# Patient Record
Sex: Male | Born: 1960 | Race: White | Hispanic: No | Marital: Married
Health system: Midwestern US, Community
[De-identification: ages and names within clinical notes are randomized; demographics above are authoritative.]

## PROBLEM LIST (undated history)

## (undated) DIAGNOSIS — E119 Type 2 diabetes mellitus without complications: Principal | ICD-10-CM

## (undated) DIAGNOSIS — I10 Essential (primary) hypertension: Secondary | ICD-10-CM

---

## 2012-02-04 LAB — LIPID PANEL
Chol/HDL Ratio: 4 (ref <5)
Cholesterol: 137 mg/dL (ref <200)
HDL: 37 mg/dL (ref 35–55)
LDL Cholesterol: 76 mg/dL (ref 0–130)
Triglycerides: 121 mg/dL (ref 0–200)
VLDL: 24 mg/dL (ref 1–30)

## 2012-02-04 LAB — COMPREHENSIVE METABOLIC PANEL
ALT: 49 U/L — ABNORMAL HIGH (ref 5–41)
AST: 29 U/L (ref <40)
Albumin/Globulin Ratio: 1.4 — ABNORMAL LOW (ref 1.7–2.5)
Albumin: 4.5 g/dL (ref 3.5–5.2)
Alkaline Phosphatase: 49 U/L (ref 40–129)
BUN: 17 mg/dL (ref 6–20)
Bun/Cre Ratio: 28 — ABNORMAL HIGH (ref 9–20)
CO2: 31 mmol/L (ref 20–31)
Calcium: 10.3 mg/dL — ABNORMAL HIGH (ref 8.6–10.0)
Chloride: 98 mmol/L (ref 98–107)
Creatinine: 0.6 mg/dL — ABNORMAL LOW (ref 0.70–1.20)
GFR African American: >60 mL/min (ref >60)
GFR Non-African American: >60 mL/min (ref >60)
Glucose: 138 mg/dL — ABNORMAL HIGH (ref 70–99)
Potassium: 4.7 mmol/L (ref 3.5–5.1)
Sodium: 140 mmol/L (ref 136–145)
Total Bilirubin: 0.36 mg/dL (ref 0.3–1.2)
Total Protein: 7.7 g/dL (ref 6.4–8.3)

## 2012-02-04 LAB — HEMOGLOBIN A1C
Estimated Avg Glucose: 146 mg/dL — ABNORMAL HIGH (ref 70–100)
Hemoglobin A1C: 6.7 % — ABNORMAL HIGH (ref 4.5–6.2)

## 2012-02-04 LAB — MICROALBUMIN, UR
Creatinine, Ur: 81.1 mg/dL (ref 28.0–217.0)
Microalb, Ur: 15 mg/L (ref <21)
Microalb/Crt. Ratio: 0 mcg/mg creat

## 2013-07-13 LAB — COMPREHENSIVE METABOLIC PANEL
ALT: 58 U/L — ABNORMAL HIGH (ref 5–41)
AST: 35 U/L (ref <40)
Albumin/Globulin Ratio: 1.4 (ref 1.0–2.5)
Albumin: 4.2 g/dL (ref 3.5–5.2)
Alkaline Phosphatase: 50 U/L (ref 40–129)
Anion Gap: 20 mmol/L — ABNORMAL HIGH (ref 8–16)
BUN: 18 mg/dL (ref 6–20)
Bun/Cre Ratio: 28 — ABNORMAL HIGH (ref 9–20)
CO2: 27 mmol/L (ref 20–31)
Calcium: 9.8 mg/dL (ref 8.6–10.4)
Chloride: 99 mmol/L (ref 98–107)
Creatinine: 0.64 mg/dL — ABNORMAL LOW (ref 0.70–1.20)
GFR African American: >60 mL/min (ref >60)
GFR Non-African American: >60 mL/min (ref >60)
Glucose: 176 mg/dL — ABNORMAL HIGH (ref 70–99)
Potassium: 4.7 mmol/L (ref 3.7–5.3)
Sodium: 141 mmol/L (ref 135–144)
Total Bilirubin: 0.54 mg/dL (ref 0.3–1.2)
Total Protein: 7.2 g/dL (ref 6.4–8.3)

## 2013-07-13 LAB — HEMOGLOBIN A1C
Estimated Avg Glucose: 166 mg/dL
Hemoglobin A1C: 7.4 % — ABNORMAL HIGH (ref 4.8–5.9)

## 2013-07-13 LAB — MICROALBUMIN, UR
Creatinine, Ur: 132.7 mg/dL (ref 28.0–217.0)
Microalb, Ur: 46 mg/L — ABNORMAL HIGH (ref <21)
Microalb/Crt. Ratio: 0 mcg/mg creat

## 2013-07-13 LAB — LIPID PANEL
Chol/HDL Ratio: 3.1 (ref <5)
Cholesterol: 131 mg/dL (ref <200)
HDL: 42 mg/dL (ref >40)
LDL Cholesterol: 74 mg/dL (ref 0–130)
Triglycerides: 76 mg/dL (ref <150)
VLDL: 15 mg/dL (ref 1–30)

## 2013-07-13 LAB — PSA SCREENING: PSA: 0.47 ug/L (ref <4.1)

## 2015-09-08 IMAGING — CT CT ABD/PEL WO (STONE PROTOCOL)
2 of 4 series · 16 of 46 positions shown, 18 images · non-contrast
Comparison: None.

Images Obtained from [facility]
CT ABDOMEN AND PELVIS WITHOUT CONTRAST
HISTORY: Kidney stones
TECHNIQUE: Axial images of the abdomen and pelvis are obtained from the hemidiaphragms to the pubic symphysis without intravenous contrast. Coronal and sagittal images were reformatted.

[Series 2: soft tissue · axial · 0.77mm/px · z∈[-480,-48]mm · 13 of 158 slices shown, 15 images]
[im 7/158  soft-tissue]
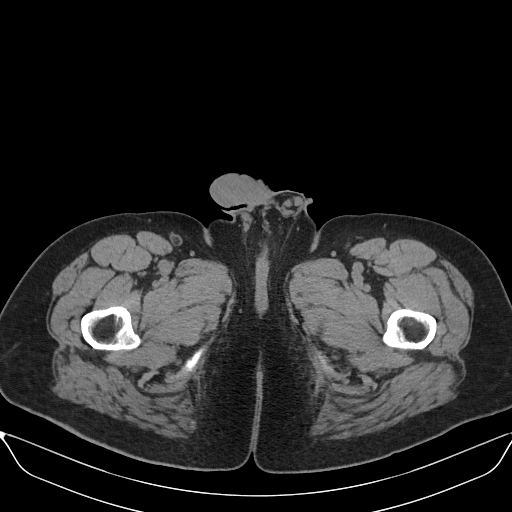
[im 7/158  bone]
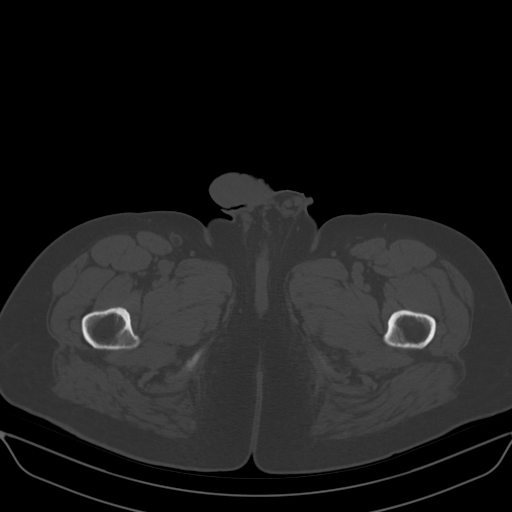
[im 20/158  soft-tissue]
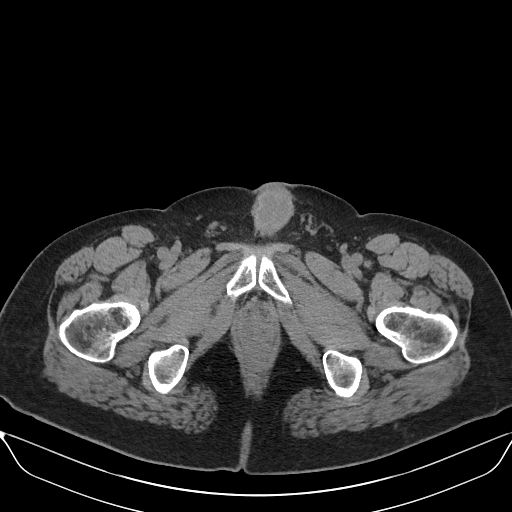
[im 33/158  soft-tissue]
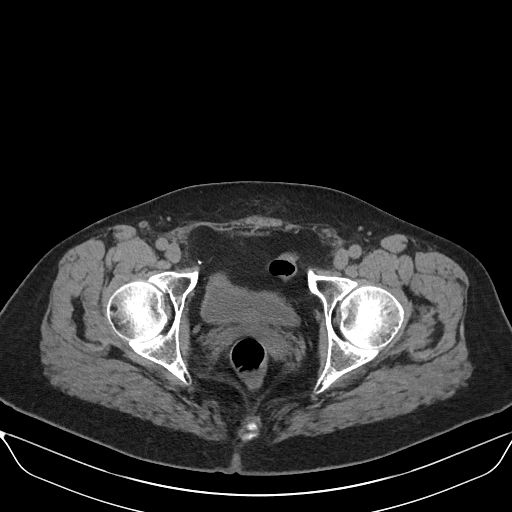
[im 46/158  soft-tissue]
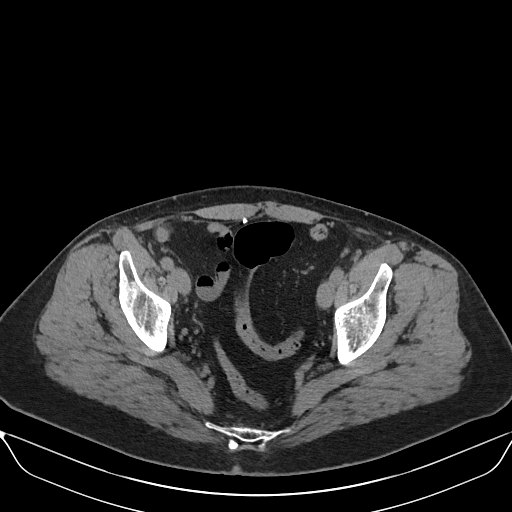
[im 53/158  soft-tissue]
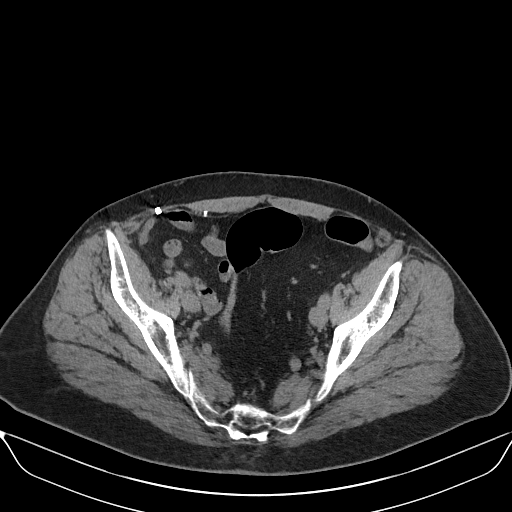
[im 66/158  soft-tissue]
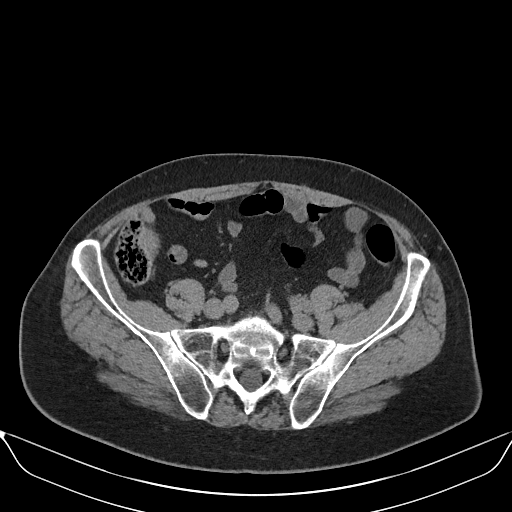
[im 79/158  soft-tissue]
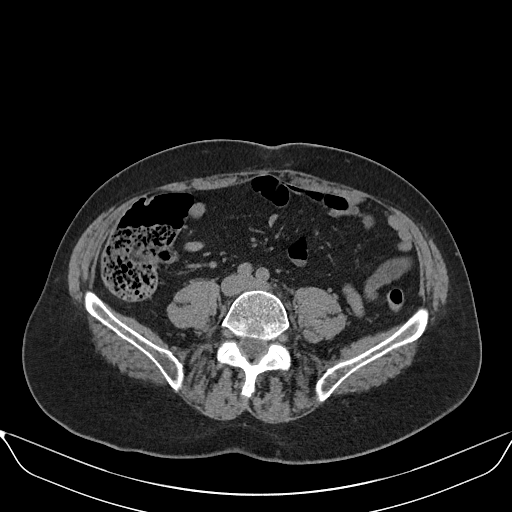
[im 92/158  soft-tissue]
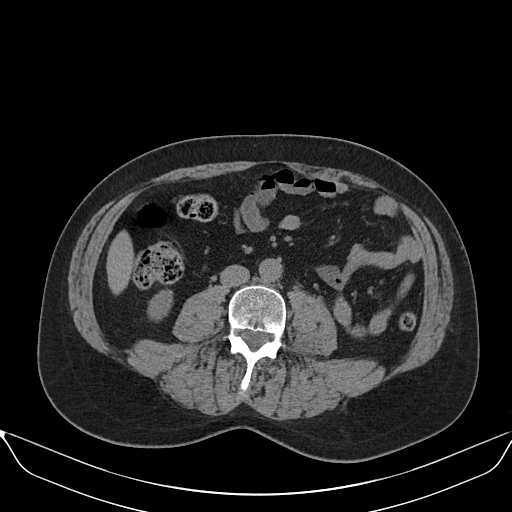
[im 105/158  soft-tissue]
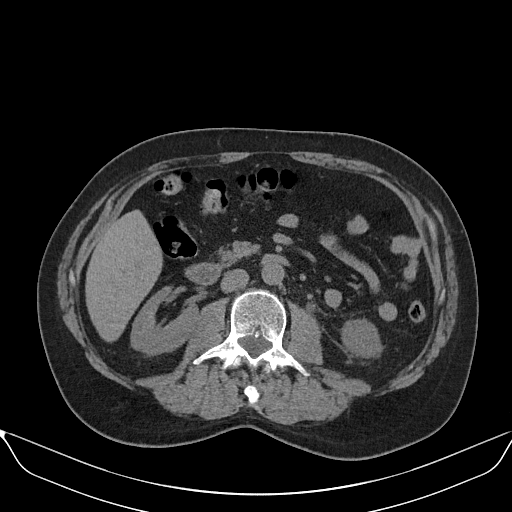
[im 105/158  bone]
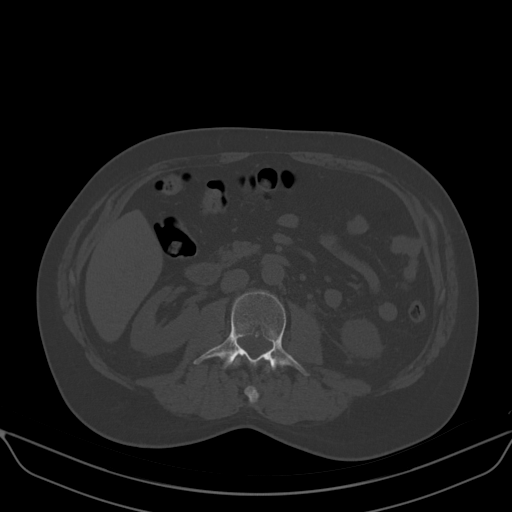
[im 112/158  soft-tissue]
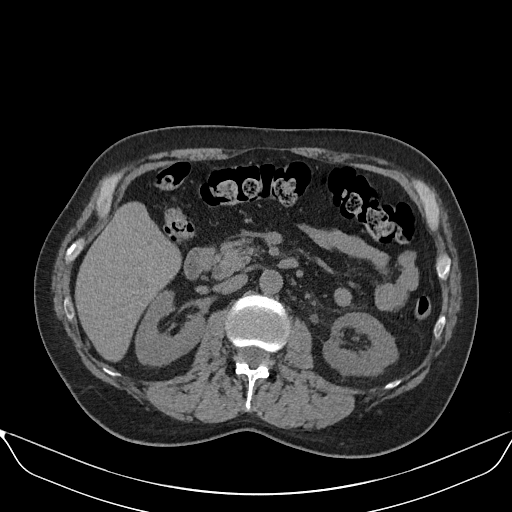
[im 125/158  soft-tissue]
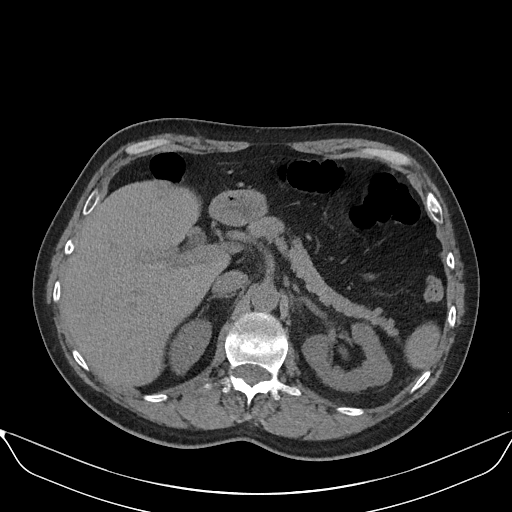
[im 138/158  soft-tissue]
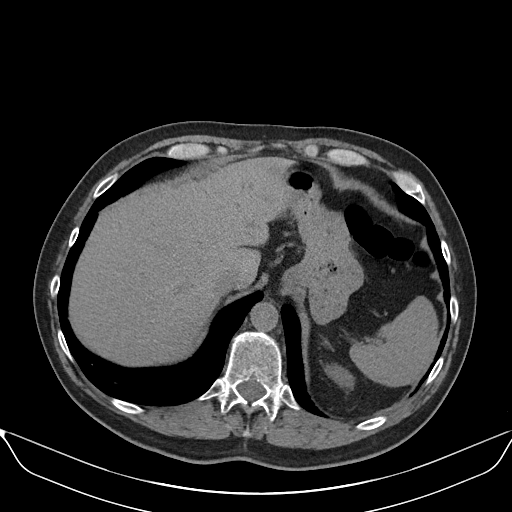
[im 151/158  soft-tissue]
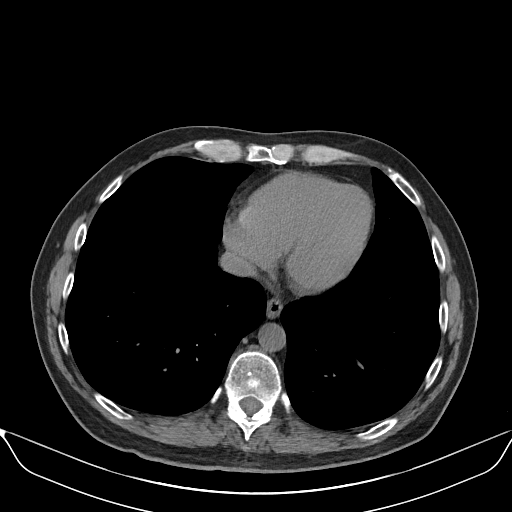

[Series 4: coronal · coronal · 0.70mm/px · 3 of 85 slices shown]
[im 29/85  soft-tissue]
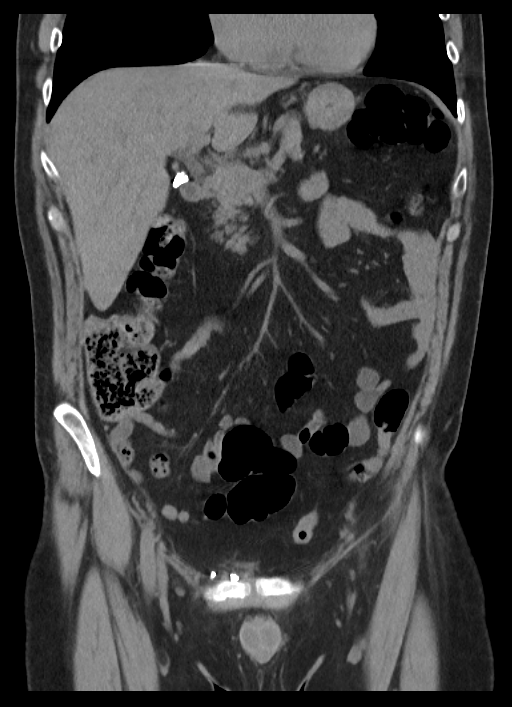
[im 38/85  soft-tissue]
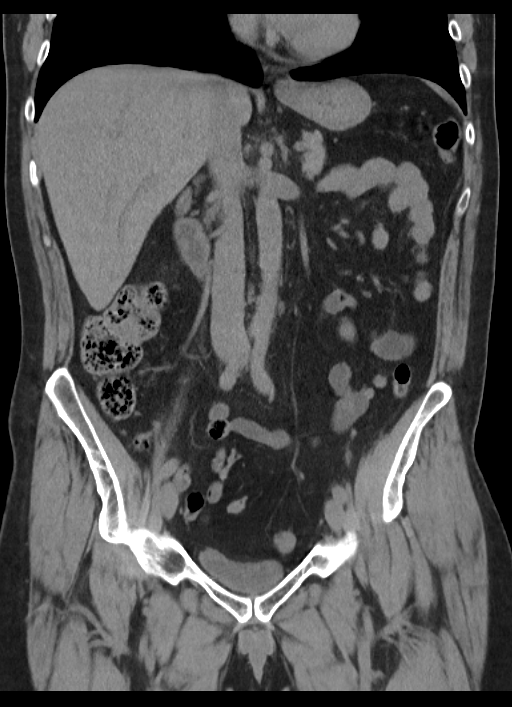
[im 47/85  soft-tissue]
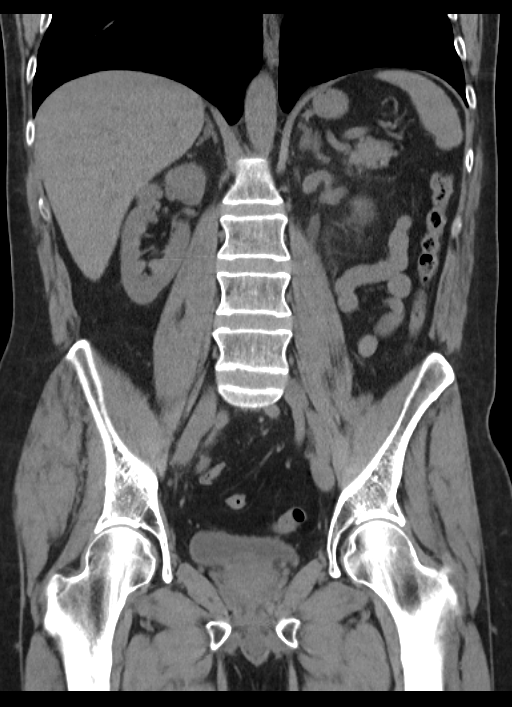

[16 of 46 positions shown; findings below may reference images not displayed]

FINDINGS: CT ABDOMEN:
Lung bases are clear. Heart size normal. There is no pericardial effusion. Aorta and inferior vena cava are normal.
5.6 mm stone proximal left ureter just distal to the left UPJ identified, producing mild left-sided hydronephrosis and perinephric fat stranding. No other stone of left kidney identified. No stone of
right kidney identified. No hydronephrosis of right kidney seen. Right ureter is normal.
There is prior cholecystectomy. Liver, pancreas, spleen and adrenal glands are normal.
Distal esophagus, collapsed stomach and small bowel loops normal. There is likely visualization of normal appendix. Colon is within normal limits. No enlarged retroperitoneal lymph nodes seen.
Curvature of lumbar spine to left with apex at L4 seen.
CT PELVIS:
Prostate gland, seminal vesicles and urinary bladder are normal. Isolated diverticulosis of sigmoid colon seen without adjacent inflammatory change. Previous right inguinal hernia repair with patch
material identified without recurrent hernia seen. No left-sided inguinal hernia identified. No enlarged lymph nodes seen. Degenerative changes of SI joints and hip joints seen.
Coronal and sagittal reformation images confirm above findings.
IMPRESSION: 5.6 mm stone proximal left ureter just distal to the left of UPJ producing mild left-sided hydronephrosis and perinephric fat stranding seen.
Additional chronic findings as noted.
Code F

## 2015-09-26 IMAGING — CR ABD KUB ONLY
1 series · 2 of 2 positions shown · non-contrast
Comparison: CT abdomen and pelvis study 09/08/2015.

HISTORY/INDICATIONS:   Kidney stone.
TECHNIQUE: KUB

[Series 1: supine kub · 0.17mm/px · 2 of 2 slices shown]
[im 1/2]
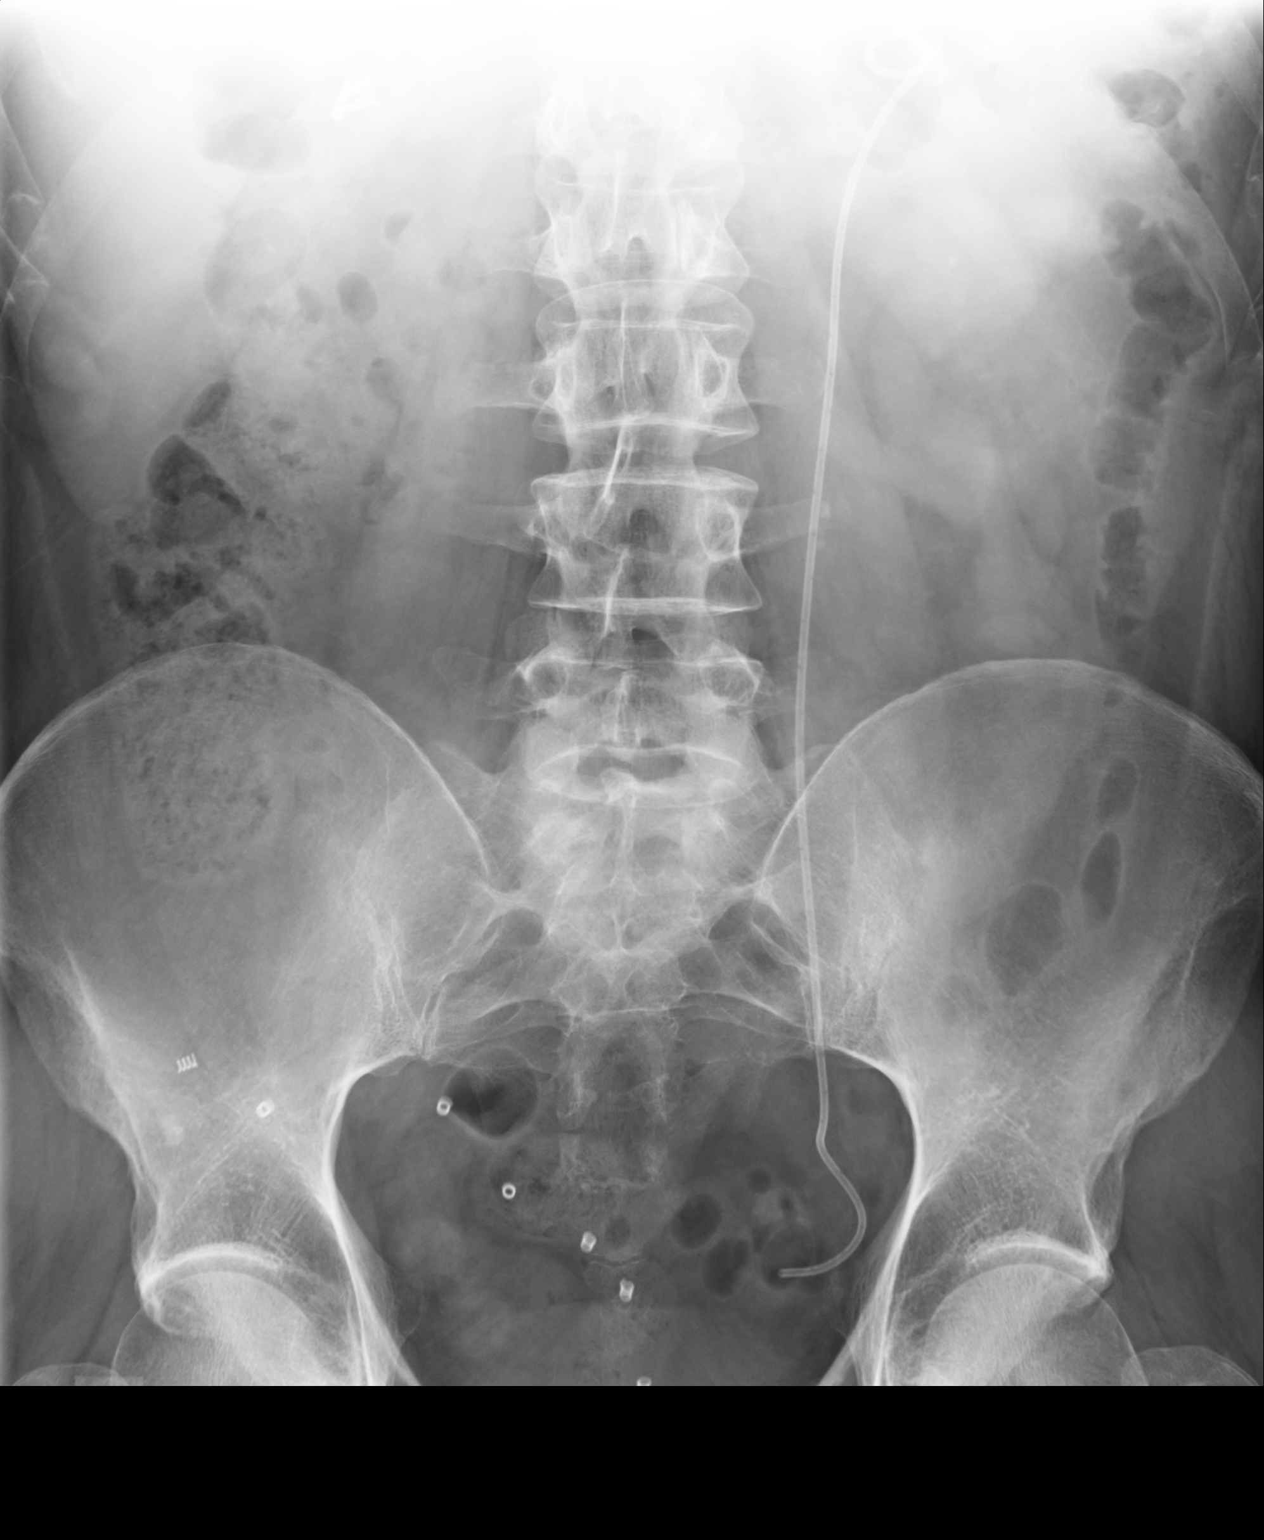
[im 2/2]
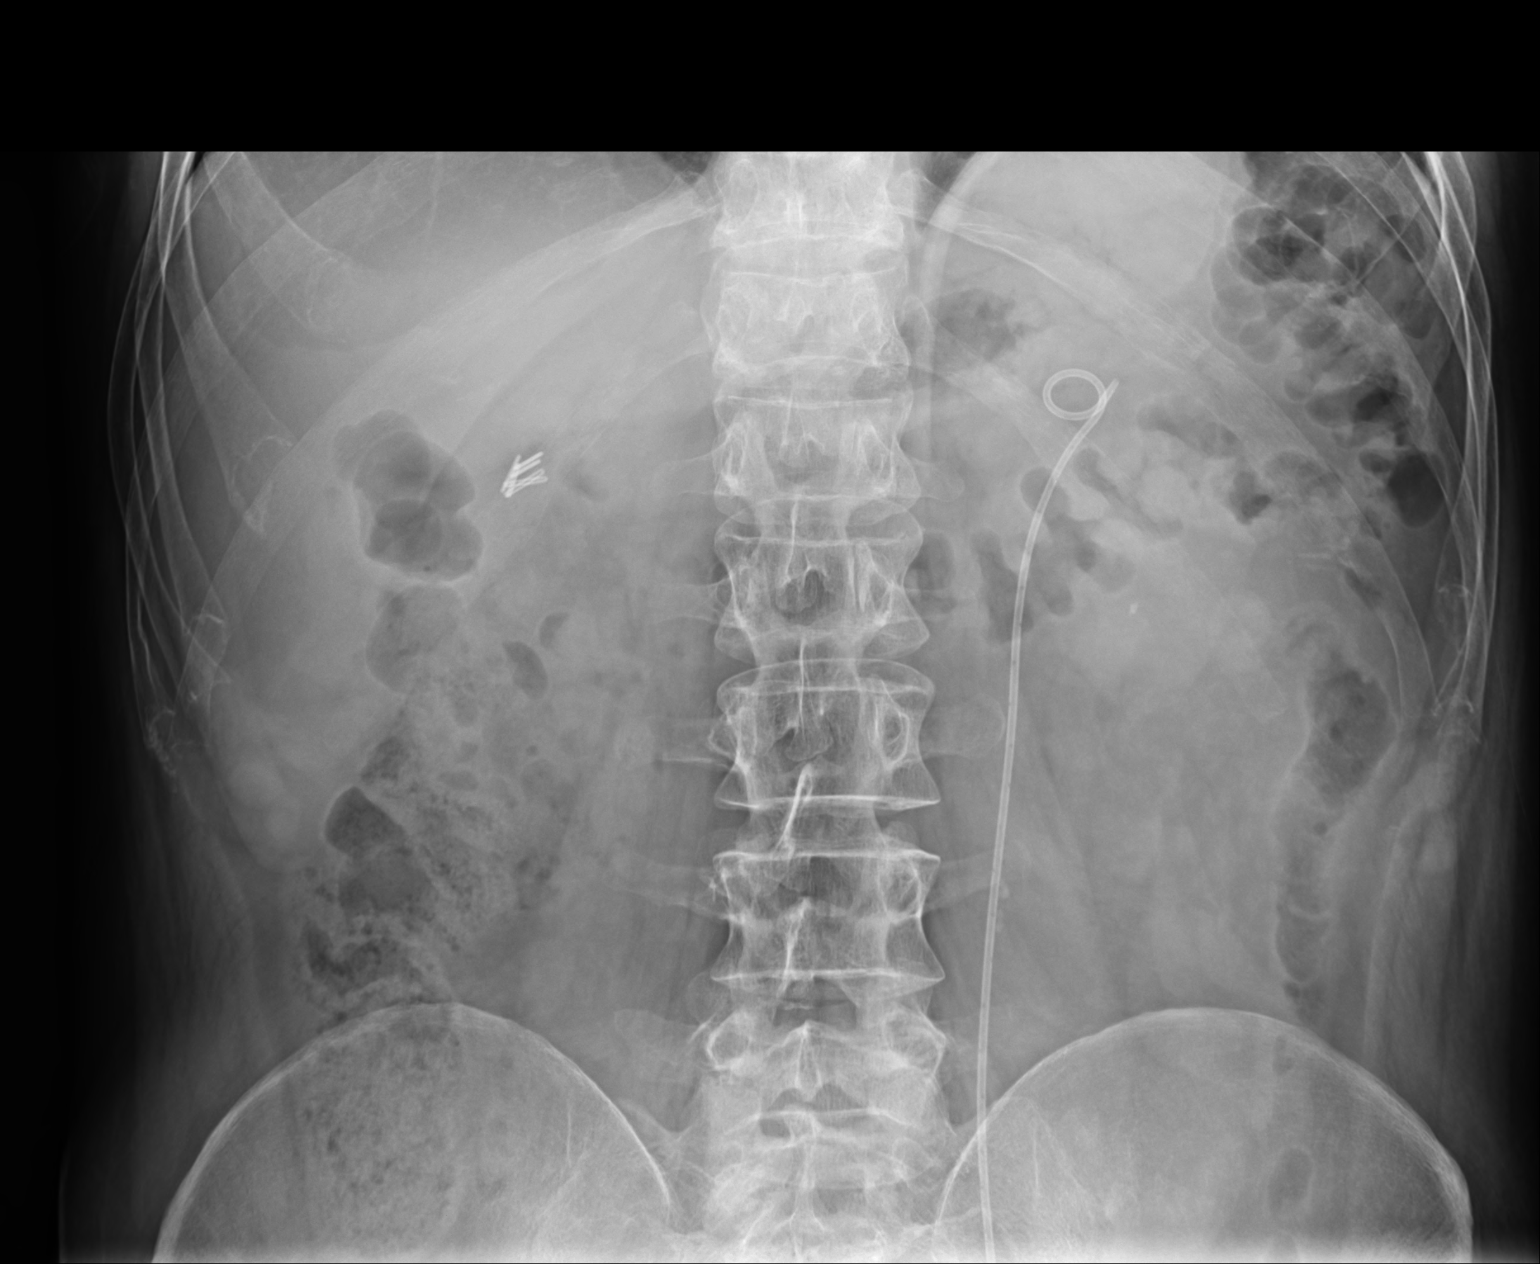

[2 of 2 positions shown; findings below may reference images not displayed]

FINDINGS: Small 3 m stone of right inferior pole of left kidney identified. There is interval placement left ureteral double-J catheter with proximal and overlying left renal pelvis with distal end  at lateral aspect of left-sided pelvis with slight uncoiling. No calcification along the course of ureters identified. There is prior cholecystectomy. Mild degenerative changes of lumbar spine the pelvis seen. Likely small phleboliths of pelvis identified. Metallic anchors for previous right-sided hernia repair seen.
IMPRESSION: Interval resolution stone proximal left ureter seen on previous study.

New small stone inferior pole of left kidney seen.

Interval placement left-sided double-J ureteral catheter with slight uncoiling distal end at the lateral aspect of left-sided pelvis identified.

## 2015-10-14 IMAGING — CR ABD KUB ONLY
1 series · 2 of 2 positions shown · non-contrast
Comparison: September 26, 2015

HISTORY/INDICATIONS:   Kidney stone
TECHNIQUE: KUB

[Series 1: supine kub · 0.17mm/px · 2 of 2 slices shown]
[im 1/2]
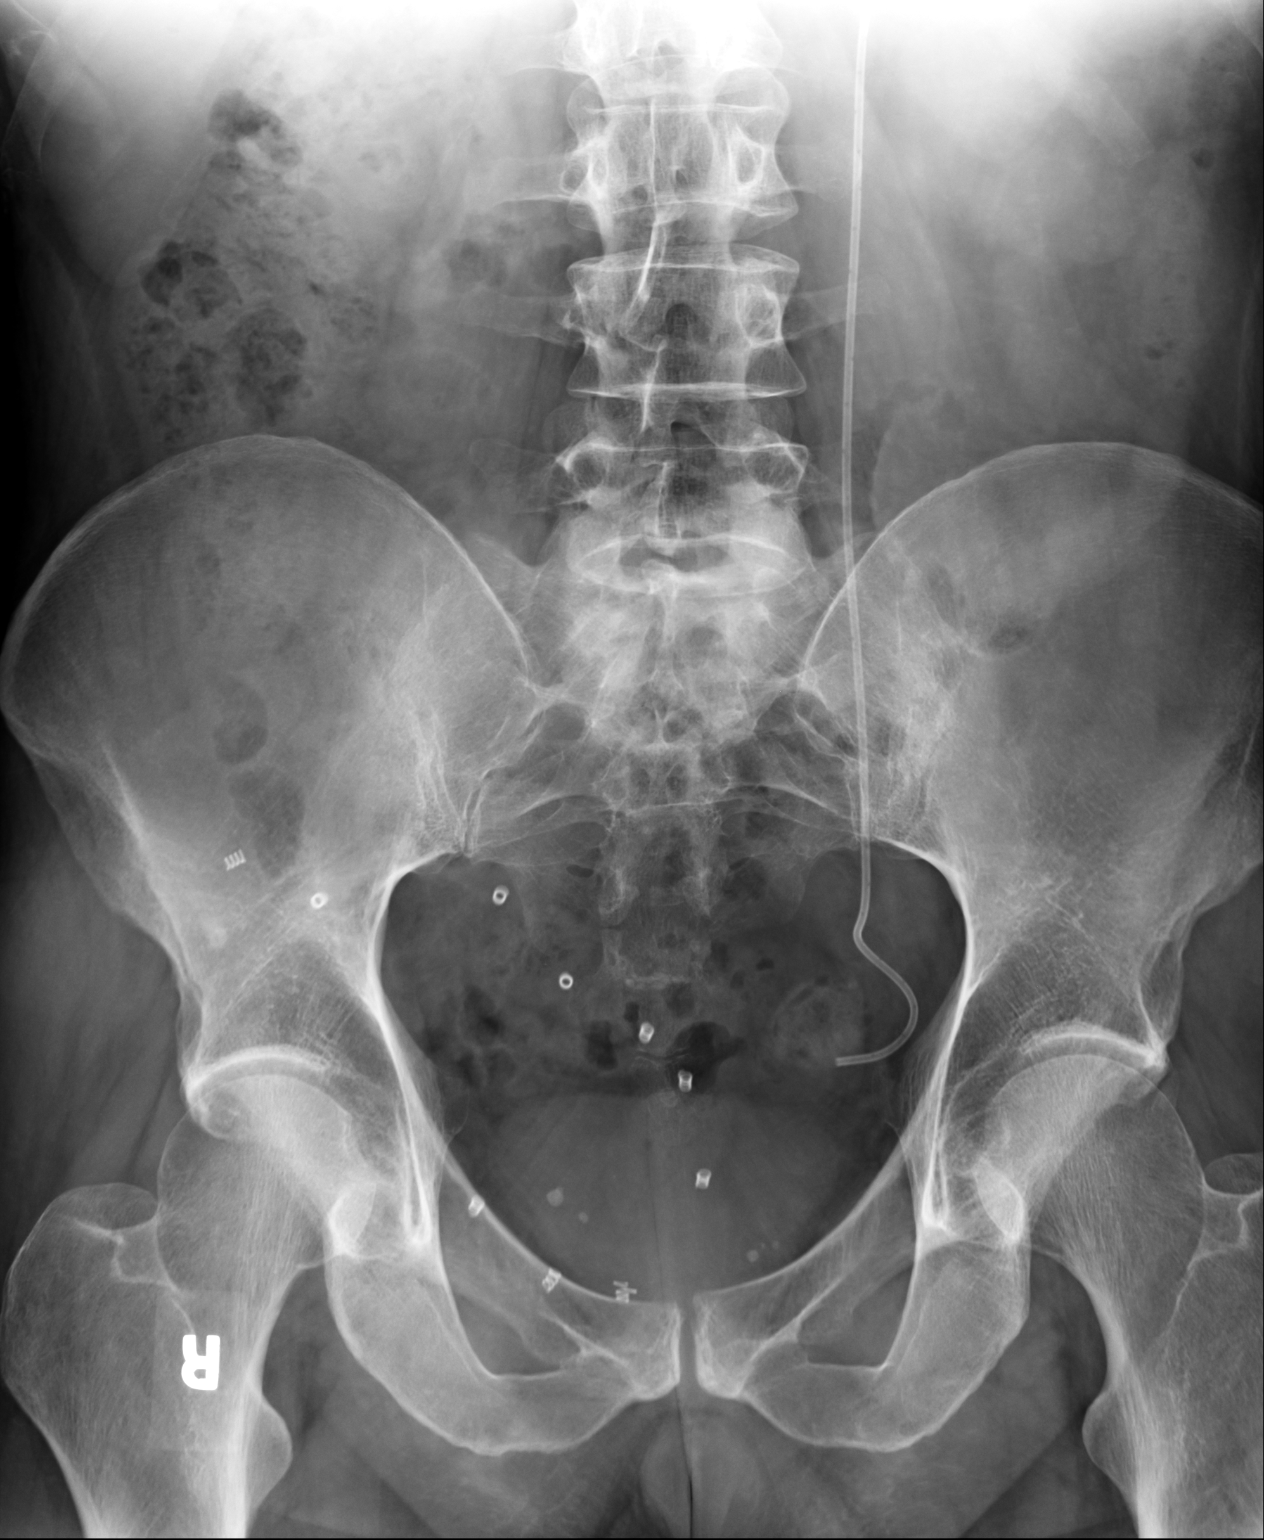
[im 2/2]
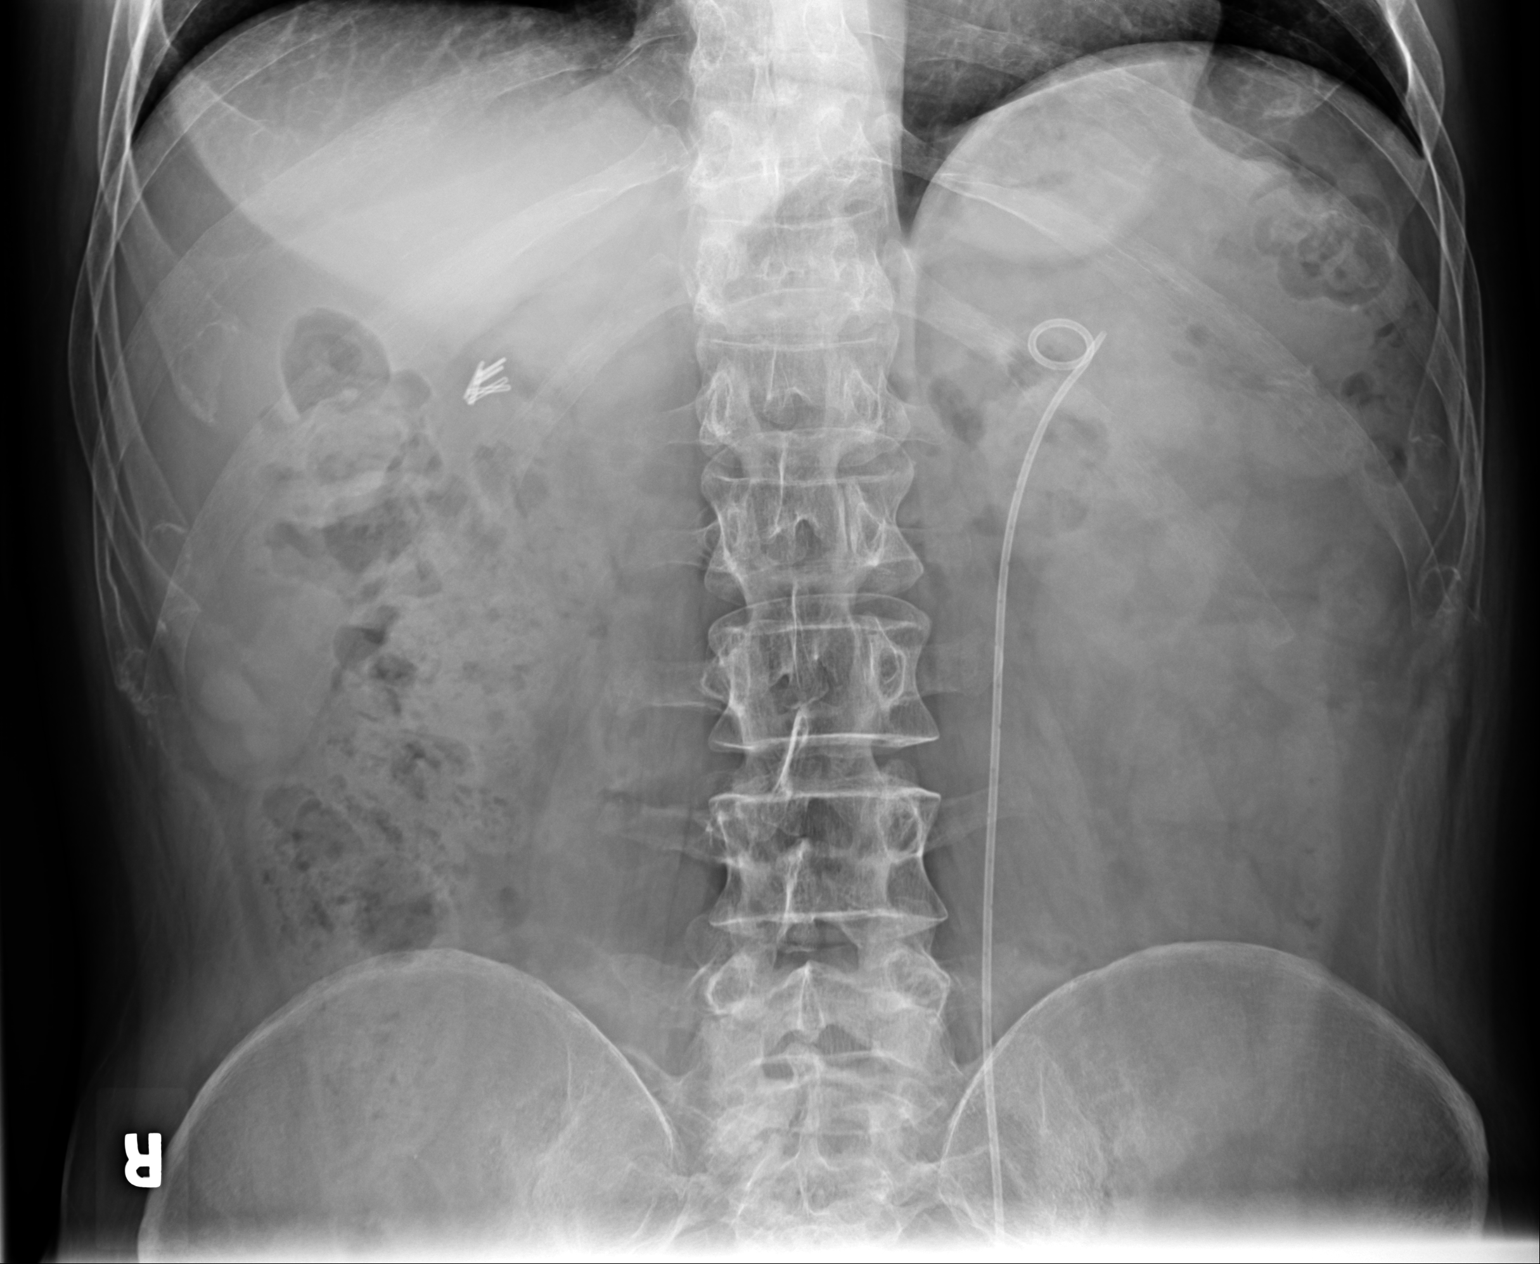

[2 of 2 positions shown; findings below may reference images not displayed]

FINDINGS: There is a left ureteral stent. The lower aspect appears uncoiled likely in the distal ureter. This is unchanged appearance from the previous exam. No collecting system stones are identified. Small left intrarenal calculus demonstrated on prior exam is not visible today.

There are surgical clips in right quadrant. Bowel gas pattern is unremarkable. There are fascia coils in the lower abdomen. There are small phleboliths in the pelvis which are unchanged.
IMPRESSION: Left internal ureteral stent unchanged in appearance from prior study, no collecting system stones demonstrated on today's exam

## 2015-12-16 IMAGING — US US RENAL RETRO COMPLETE
1 series · 14 of 25 positions shown · non-contrast
Comparison: Stone protocol CT 09/08/2015. This shows 6 mm stone in proximal left ureter.

HISTORY: Calculus of kidney.
TECHNIQUE: Renal ultrasound.

[Series 1: us renal retro complete · 14 of 28 slices shown]
[im 1/28]
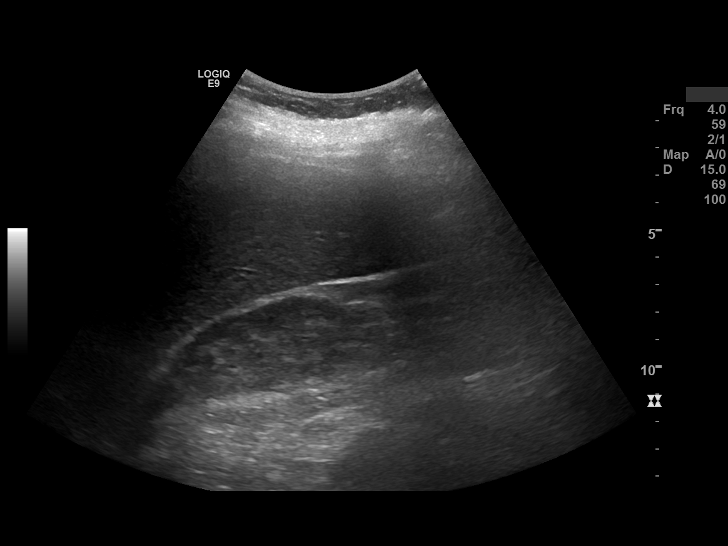
[im 3/28]
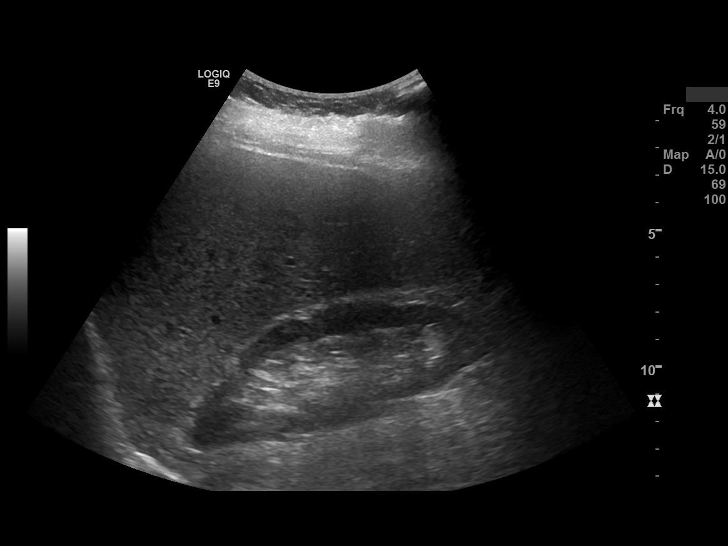
[im 5/28]
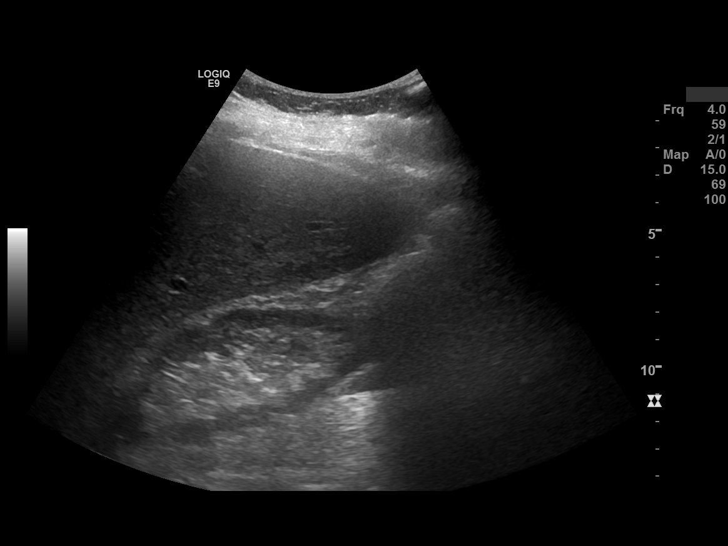
[im 7/28]
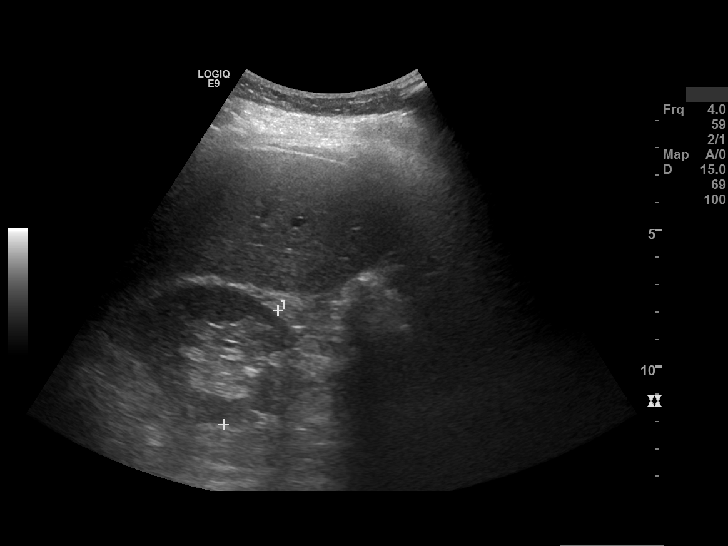
[im 10/28]
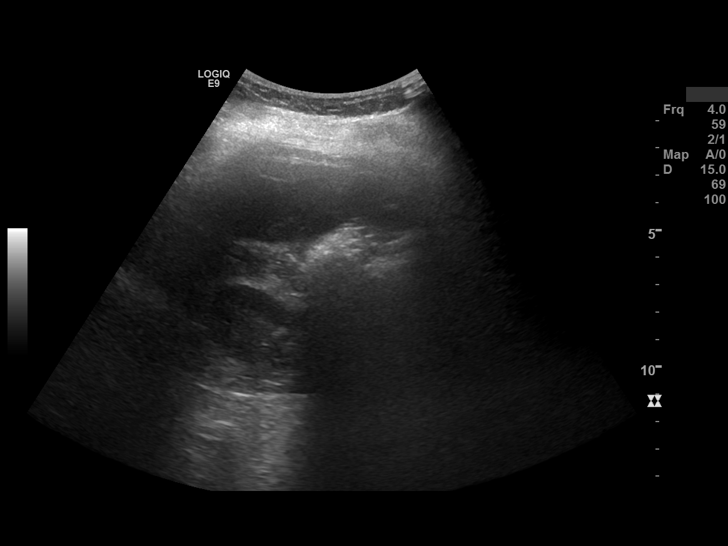
[im 11/28]
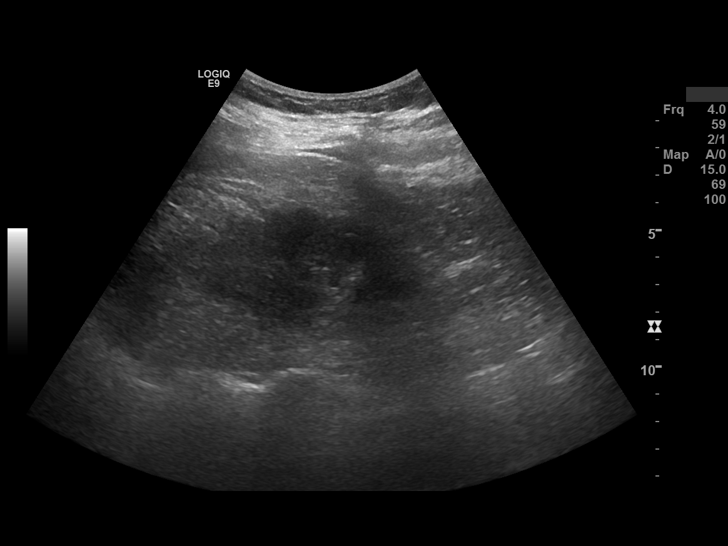
[im 13/28]
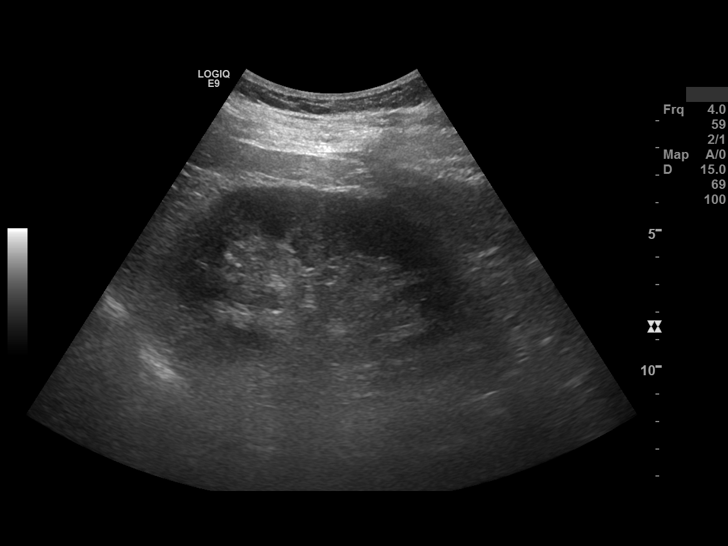
[im 15/28]
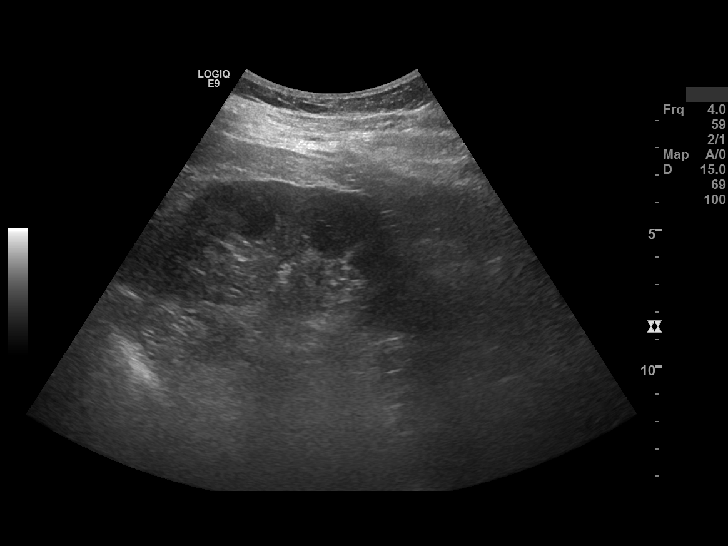
[im 17/28]
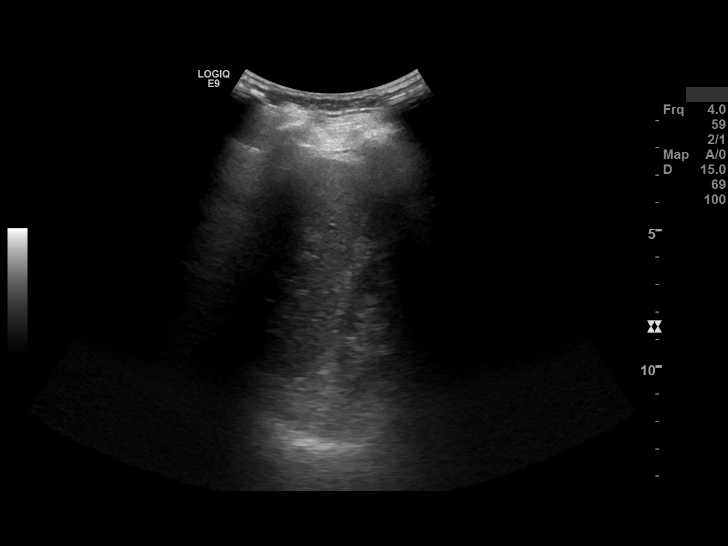
[im 19/28]
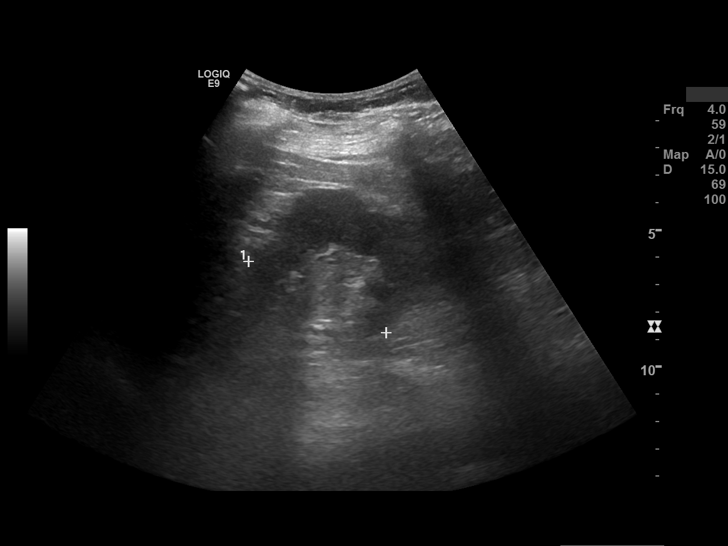
[im 21/28]
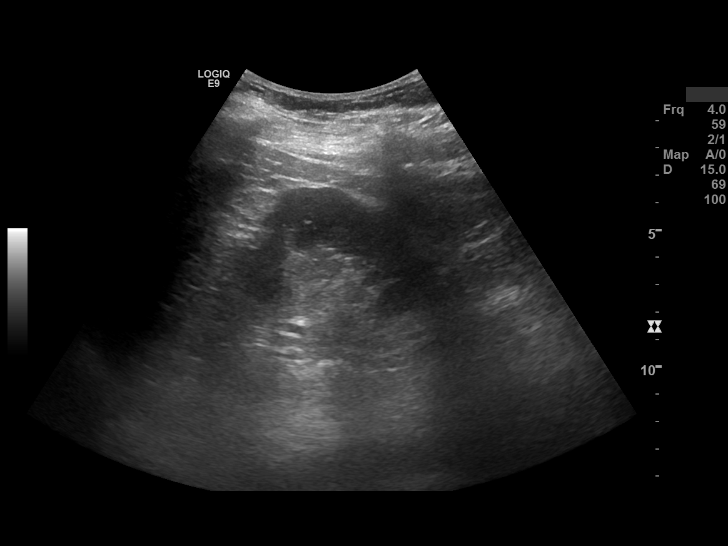
[im 23/28]
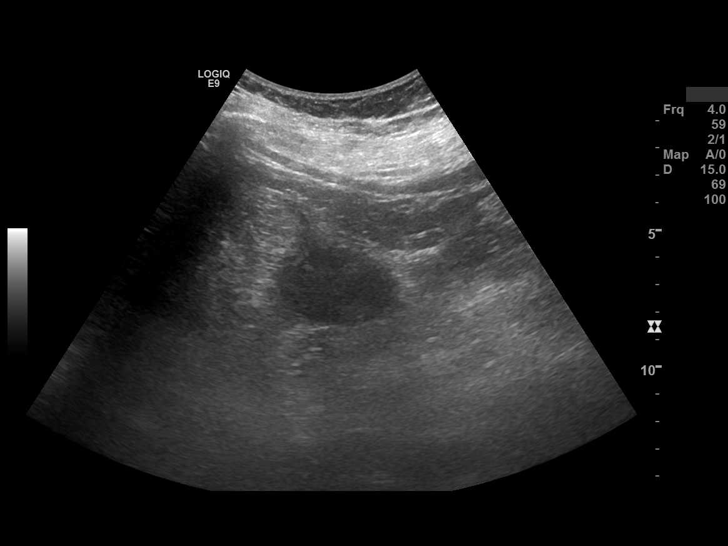
[im 25/28]
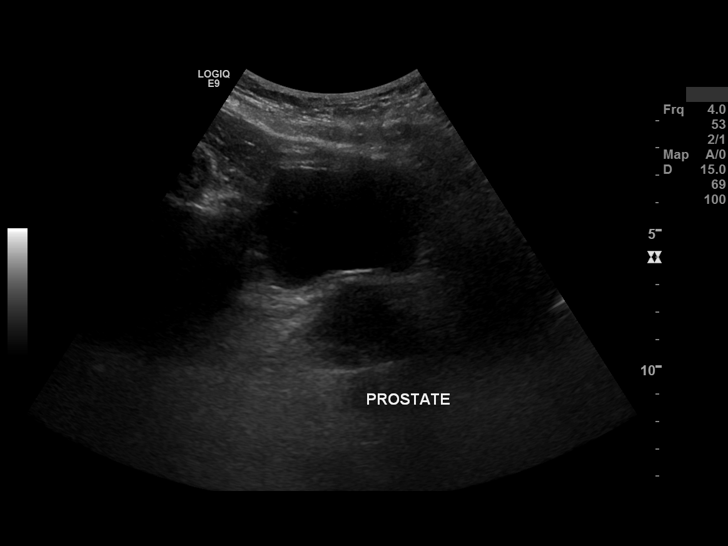
[im 28/28]
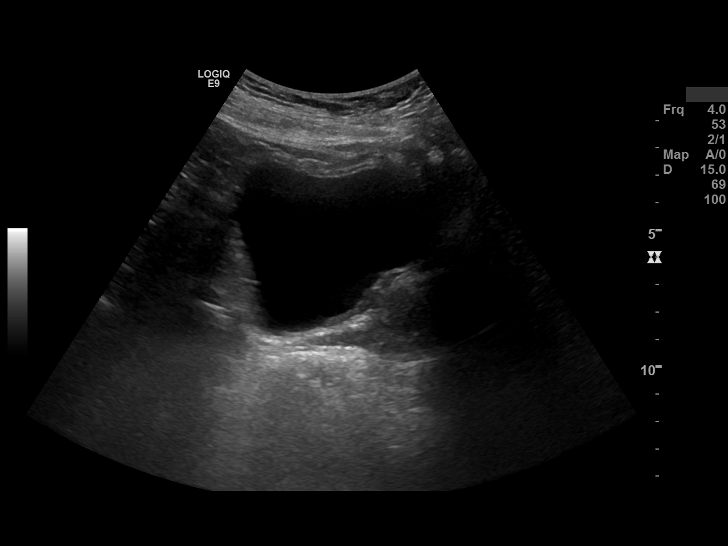

[14 of 25 positions shown; findings below may reference images not displayed]

FINDINGS: Right kidney 113 x 44 x 46 mm, cortex 8 mm. No hydronephrosis.

Left kidney 107 x 62 x 57 mm, cortex 17 mm. No hydronephrosis.

Urinary bladder is unremarkable.
IMPRESSION: 1. Left kidney hydronephrosis has cleared.

2. No nephrolithiasis.

## 2016-10-05 ENCOUNTER — Ambulatory Visit: Admit: 2016-10-05 | Discharge: 2016-10-05 | Payer: PRIVATE HEALTH INSURANCE | Attending: Family Medicine

## 2016-10-05 DIAGNOSIS — E119 Type 2 diabetes mellitus without complications: Secondary | ICD-10-CM

## 2016-10-05 MED ORDER — METFORMIN HCL 1000 MG PO TABS
1000 | ORAL_TABLET | Freq: Two times a day (BID) | ORAL | 3 refills | Status: DC
Start: 2016-10-05 — End: 2017-12-18

## 2016-10-05 MED ORDER — PIOGLITAZONE HCL 45 MG PO TABS
45 | ORAL_TABLET | Freq: Every day | ORAL | 3 refills | Status: DC
Start: 2016-10-05 — End: 2017-09-28

## 2016-10-05 MED ORDER — SIMVASTATIN 40 MG PO TABS
40 | ORAL_TABLET | Freq: Every evening | ORAL | 3 refills | Status: DC
Start: 2016-10-05 — End: 2017-09-28

## 2016-10-05 MED ORDER — LOSARTAN POTASSIUM-HCTZ 100-25 MG PO TABS
100-25 | ORAL_TABLET | Freq: Every day | ORAL | 3 refills | Status: DC
Start: 2016-10-05 — End: 2017-09-28

## 2016-10-05 NOTE — Progress Notes (Signed)
Subjective:      Patient ID: Gabriel Howell is a 56 y.o. male.    Has had poorly controlled diabetes for several years  Last hga1c was 9.1        Diabetes   He presents for his follow-up diabetic visit. He has type 2 diabetes mellitus. No MedicAlert identification noted. There are no hypoglycemic associated symptoms. There are no diabetic associated symptoms. There are no hypoglycemic complications. There are no diabetic complications. Risk factors for coronary artery disease include diabetes mellitus, dyslipidemia and hypertension. Current diabetic treatment includes oral agent (dual therapy). He is compliant with treatment most of the time. His weight is stable. He is following a generally healthy diet. He participates in exercise intermittently. An ACE inhibitor/angiotensin II receptor blocker is being taken. He does not see a podiatrist.Eye exam is current.       Review of Systems   HENT: Negative.    Respiratory: Negative.    Cardiovascular: Negative.    Gastrointestinal: Negative.    Genitourinary: Negative.    Musculoskeletal: Negative.    Neurological: Negative.    Hematological: Negative.    Psychiatric/Behavioral: Negative.        Objective:   Physical Exam   Constitutional: He is oriented to person, place, and time. He appears well-developed and well-nourished.   HENT:   Head: Normocephalic and atraumatic.   Right Ear: External ear normal.   Left Ear: External ear normal.   Nose: Nose normal.   Mouth/Throat: Oropharynx is clear and moist.   Eyes: Pupils are equal, round, and reactive to light. EOM are normal.   Neck: No tracheal deviation present. No thyromegaly present.   Cardiovascular: Normal rate and regular rhythm.    Pulmonary/Chest: Effort normal and breath sounds normal.   Abdominal: Soft. Bowel sounds are normal.   Musculoskeletal: He exhibits no edema.   Lymphadenopathy:     He has no cervical adenopathy.   Neurological: He is alert and oriented to person, place, and time.   Skin: Skin is  warm.   Psychiatric: He has a normal mood and affect.   Nursing note and vitals reviewed.  Visual inspection:  Deformity/amputation: absent  Skin lesions/pre-ulcerative calluses: absent  Edema: right- negative, left- negative    Sensory exam:  Monofilament sensation: normal  (minimum of 5 random plantar locations tested, avoiding callused areas - > 1 area with absence of sensation is + for neuropathy)    Plus at least one of the following:  Pulses: normal,   Pinprick: N/A  Proprioception: N/A  Vibration (128 Hz): N/A      Assessment:      Diabetes  Hypertension  Hyperlipidemia  Overweight  Colon polyps        Plan:      Will obtain his last eye exam and colonoscopy.  Labs to be done in October and if hga1c.  Flu shot this fall  Follow up after labs       Chrissie Noa received counseling on the following healthy behaviors: nutrition and exercise  Reviewed prior labs and health maintenance  Continue current medications, diet and exercise.  Discussed use, benefit, and side effects of prescribed medications. Barriers to medication compliance addressed.   Patient given educational materials - see patient instructions  Was a self-tracking handout given in paper form or via MyChart? Yes    Requested Prescriptions      No prescriptions requested or ordered in this encounter       All patient questions answered.  Patient voiced understanding.    Quality Measures    Body mass index is 29.52 kg/m. Elevated. Weight control planned discussed Healthy diet and regular exercise.    BP: 92/70 Blood pressure is normal. Treatment plan consists of No treatment change needed.    Lab Results   Component Value Date    LDLCHOLESTEROL 74 07/13/2013    (goal LDL reduction with dx if diabetes is 50% LDL reduction)      PHQ Scores 10/05/2016   PHQ2 Score 0   PHQ9 Score 0     Interpretation of Total Score Depression Severity: 1-4 = Minimal depression, 5-9 = Mild depression, 10-14 = Moderate depression, 15-19 = Moderately severe depression, 20-27 =  Severe depression          Reesa Chew, MD

## 2016-10-05 NOTE — Patient Instructions (Signed)
Patient Education        Eating Healthy Foods: Care Instructions  Your Care Instructions    Eating healthy foods can help lower your risk for disease. Healthy food gives you energy and keeps your heart strong, your brain active, your muscles working, and your bones strong.  A healthy diet includes a variety of foods from the basic food groups: grains, vegetables, fruits, milk and milk products, and meat and beans. Some people may eat more of their favorite foods from only one food group and, as a result, miss getting the nutrients they need. So, it is important to pay attention not only to what you eat but also to what you are missing from your diet. You can eat a healthy, balanced diet by making a few small changes.  Follow-up care is a key part of your treatment and safety. Be sure to make and go to all appointments, and call your doctor if you are having problems. It's also a good idea to know your test results and keep a list of the medicines you take.  How can you care for yourself at home?  Look at what you eat   Keep a food diary for a week or two and record everything you eat or drink. Track the number of servings you eat from each food group.   For a balanced diet every day, eat a variety of:   6 or more ounce-equivalents of grains, such as cereals, breads, crackers, rice, or pasta, every day. An ounce-equivalent is 1 slice of bread, 1 cup of ready-to-eat cereal, or  cup of cooked rice, cooked pasta, or cooked cereal.   2 cups of vegetables, especially:   Dark-green vegetables such as broccoli and spinach.   Orange vegetables such as carrots and sweet potatoes.   Dry beans (such as pinto and kidney beans) and peas (such as lentils).   2 cups of fresh, frozen, or canned fruit. A small apple or 1 banana or orange equals 1 cup.   3 cups of nonfat or low-fat milk, yogurt, or other milk products.   5 ounces of meat and beans, such as chicken, fish, lean meat, beans, nuts, and seeds. One egg, 1  tablespoon of peanut butter,  ounce nuts or seeds, or  cup of cooked beans equals 1 ounce of meat.   Learn how to read food labels for serving sizes and ingredients. Fast-food and convenience-food meals often contain few or no fruits or vegetables. Make sure you eat some fruits and vegetables to make the meal more nutritious.   Look at your food diary. For each food group, add up what you have eaten and then divide the total by the number of days. This will give you an idea of how much you are eating from each food group. See if you can find some ways to change your diet to make it more healthy.  Start small   Do not try to make dramatic changes to your diet all at once. You might feel that you are missing out on your favorite foods and then be more likely to fail.   Start slowly, and gradually change your habits. Try some of the following:   Use whole wheat bread instead of white bread.   Use nonfat or low-fat milk instead of whole milk.   Eat brown rice instead of white rice, and eat whole wheat pasta instead of white-flour pasta.   Try low-fat cheeses and low-fat yogurt.   Add more   fruits and vegetables to meals and have them for snacks.   Add lettuce, tomato, cucumber, and onion to sandwiches.   Add fruit to yogurt and cereal.  Enjoy food   You can still eat your favorite foods. You just may need to eat less of them. If your favorite foods are high in fat, salt, and sugar, limit how often you eat them, but do not cut them out entirely.   Eat a wide variety of foods.  Make healthy choices when eating out   The type of restaurant you choose can help you make healthy choices. Even fast-food chains are now offering more low-fat or healthier choices on the menu.   Choose smaller portions, or take half of your meal home.   When eating out, try:   A veggie pizza with a whole wheat crust or grilled chicken (instead of sausage or pepperoni).   Pasta with roasted vegetables, grilled chicken, or  marinara sauce instead of cream sauce.   A vegetable wrap or grilled chicken wrap.   Broiled or poached food instead of fried or breaded items.  Make healthy choices easy   Buy packaged, prewashed, ready-to-eat fresh vegetables and fruits, such as baby carrots, salad mixes, and chopped or shredded broccoli and cauliflower.   Buy packaged, presliced fruits, such as melon or pineapple.   Choose 100% fruit or vegetable juice instead of soda. Limit juice intake to 4 to 6 oz ( to  cup) a day.   Blend low-fat yogurt, fruit juice, and canned or frozen fruit to make a smoothie for breakfast or a snack.  Where can you learn more?  Go to [email or web address] and sign in to your MyChart account. Enter [phone] in the Search Health Information box to learn more about "Eating Healthy Foods: Care Instructions."     If you do not have an account, please click on the "Sign Up Now" link.  Current as of: Jun 20, 2015  Content Version: 11.7   2006-2018 [employer]. Care instructions adapted under license by [facility]. If you have questions about a medical condition or this instruction, always ask your healthcare professional. [employer] disclaims any warranty or liability for your use of this information.       Patient Education        Learning About Diabetes Food Guidelines  Your Care Instructions    Meal planning is important to manage diabetes. It helps keep your blood sugar at a target level (which you set with your doctor). You don't have to eat special foods. You can eat what your family eats, including sweets once in a while. But you do have to pay attention to how often you eat and how much you eat of certain foods.  You may want to work with a dietitian or a certified diabetes educator (CDE) to help you plan meals and snacks. A dietitian or CDE can also help you lose weight if that is one of your goals.  What should you know about eating carbs?  Managing the amount of  carbohydrate (carbs) you eat is an important part of healthy meals when you have diabetes. Carbohydrate is found in many foods.   Learn which foods have carbs. And learn the amounts of carbs in different foods.   Bread, cereal, pasta, and rice have about 15 grams of carbs in a serving. A serving is 1 slice of bread (1 ounce),  cup of cooked cereal, or 1/3 cup of cooked pasta or rice.  Fruits have 15 grams of carbs in a serving. A serving is 1 small fresh fruit, such as an apple or orange;  of a banana;  cup of cooked or canned fruit;  cup of fruit juice; 1 cup of melon or raspberries; or 2 tablespoons of dried fruit.   Milk and no-sugar-added yogurt have 15 grams of carbs in a serving. A serving is 1 cup of milk or 2/3 cup of no-sugar-added yogurt.   Starchy vegetables have 15 grams of carbs in a serving. A serving is  cup of mashed potatoes or sweet potato; 1 cup winter squash;  of a small baked potato;  cup of cooked beans; or  cup cooked corn or green peas.   Learn how much carbs to eat each day and at each meal. A dietitian or CDE can teach you how to keep track of the amount of carbs you eat. This is called carbohydrate counting.   If you are not sure how to count carbohydrate grams, use the Plate Method to plan meals. It is a good, quick way to make sure that you have a balanced meal. It also helps you spread carbs throughout the day.   Divide your plate by types of foods. Put non-starchy vegetables on half the plate, meat or other protein food on one-quarter of the plate, and a grain or starchy vegetable in the final quarter of the plate. To this you can add a small piece of fruit and 1 cup of milk or yogurt, depending on how many carbs you are supposed to eat at a meal.   Try to eat about the same amount of carbs at each meal. Do not "save up" your daily allowance of carbs to eat at one meal.   Proteins have very little or no carbs per serving. Examples of proteins are beef, chicken,  [place], fish, eggs, tofu, cheese, cottage cheese, and peanut butter. A serving size of meat is 3 ounces, which is about the size of a deck of cards. Examples of meat substitute serving sizes (equal to 1 ounce of meat) are 1/4 cup of cottage cheese, 1 egg, 1 tablespoon of peanut butter, and  cup of tofu.  How can you eat out and still eat healthy?   Learn to estimate the serving sizes of foods that have carbohydrate. If you measure food at home, it will be easier to estimate the amount in a serving of restaurant food.   If the meal you order has too much carbohydrate (such as potatoes, corn, or baked beans), ask to have a low-carbohydrate food instead. Ask for a salad or green vegetables.   If you use insulin, check your blood sugar before and after eating out to help you plan how much to eat in the future.   If you eat more carbohydrate at a meal than you had planned, take a walk or do other exercise. This will help lower your blood sugar.  What else should you know?   Limit saturated fat, such as the fat from meat and dairy products. This is a healthy choice because people who have diabetes are at higher risk of heart disease. So choose lean cuts of meat and nonfat or low-fat dairy products. Use olive or canola oil instead of butter or shortening when cooking.   Don't skip meals. Your blood sugar may drop too low if you skip meals and take insulin or certain medicines for diabetes.   Check with your doctor before you drink alcohol.  Alcohol can cause your blood sugar to drop too low. Alcohol can also cause a bad reaction if you take certain diabetes medicines.  Follow-up care is a key part of your treatment and safety. Be sure to make and go to all appointments, and call your doctor if you are having problems. It's also a good idea to know your test results and keep a list of the medicines you take.  Where can you learn more?  Go to [email or web address] and sign in to your MyChart account.  Enter [phone] in the Search Health Information box to learn more about "Learning About Diabetes Food Guidelines."     If you do not have an account, please click on the "Sign Up Now" link.  Current as of: January 15, 2016  Content Version: 11.7   2006-2018 [employer]. Care instructions adapted under license by [facility]. If you have questions about a medical condition or this instruction, always ask your healthcare professional. [employer] disclaims any warranty or liability for your use of this information.

## 2016-11-09 ENCOUNTER — Other Ambulatory Visit: Payer: PRIVATE HEALTH INSURANCE

## 2016-11-12 ENCOUNTER — Inpatient Hospital Stay: Payer: PRIVATE HEALTH INSURANCE

## 2016-11-16 ENCOUNTER — Ambulatory Visit: Admit: 2016-11-16 | Discharge: 2016-11-16 | Payer: PRIVATE HEALTH INSURANCE | Attending: Family Medicine

## 2016-11-16 ENCOUNTER — Encounter

## 2016-11-16 DIAGNOSIS — E119 Type 2 diabetes mellitus without complications: Secondary | ICD-10-CM

## 2016-11-16 NOTE — Patient Instructions (Signed)
Patient Education        Learning About Diabetes Food Guidelines  Your Care Instructions    Meal planning is important to manage diabetes. It helps keep your blood sugar at a target level (which you set with your doctor). You don't have to eat special foods. You can eat what your family eats, including sweets once in a while. But you do have to pay attention to how often you eat and how much you eat of certain foods.  You may want to work with a dietitian or a certified diabetes educator (CDE) to help you plan meals and snacks. A dietitian or CDE can also help you lose weight if that is one of your goals.  What should you know about eating carbs?  Managing the amount of carbohydrate (carbs) you eat is an important part of healthy meals when you have diabetes. Carbohydrate is found in many foods.   Learn which foods have carbs. And learn the amounts of carbs in different foods.   Bread, cereal, pasta, and rice have about 15 grams of carbs in a serving. A serving is 1 slice of bread (1 ounce),  cup of cooked cereal, or 1/3 cup of cooked pasta or rice.   Fruits have 15 grams of carbs in a serving. A serving is 1 small fresh fruit, such as an apple or orange;  of a banana;  cup of cooked or canned fruit;  cup of fruit juice; 1 cup of melon or raspberries; or 2 tablespoons of dried fruit.   Milk and no-sugar-added yogurt have 15 grams of carbs in a serving. A serving is 1 cup of milk or 2/3 cup of no-sugar-added yogurt.   Starchy vegetables have 15 grams of carbs in a serving. A serving is  cup of mashed potatoes or sweet potato; 1 cup winter squash;  of a small baked potato;  cup of cooked beans; or  cup cooked corn or green peas.   Learn how much carbs to eat each day and at each meal. A dietitian or CDE can teach you how to keep track of the amount of carbs you eat. This is called carbohydrate counting.   If you are not sure how to count carbohydrate grams, use the Plate Method to plan meals. It is a  good, quick way to make sure that you have a balanced meal. It also helps you spread carbs throughout the day.   Divide your plate by types of foods. Put non-starchy vegetables on half the plate, meat or other protein food on one-quarter of the plate, and a grain or starchy vegetable in the final quarter of the plate. To this you can add a small piece of fruit and 1 cup of milk or yogurt, depending on how many carbs you are supposed to eat at a meal.   Try to eat about the same amount of carbs at each meal. Do not "save up" your daily allowance of carbs to eat at one meal.   Proteins have very little or no carbs per serving. Examples of proteins are beef, chicken, turkey, fish, eggs, tofu, cheese, cottage cheese, and peanut butter. A serving size of meat is 3 ounces, which is about the size of a deck of cards. Examples of meat substitute serving sizes (equal to 1 ounce of meat) are 1/4 cup of cottage cheese, 1 egg, 1 tablespoon of peanut butter, and  cup of tofu.  How can you eat out and still eat healthy?     Learn to estimate the serving sizes of foods that have carbohydrate. If you measure food at home, it will be easier to estimate the amount in a serving of restaurant food.   If the meal you order has too much carbohydrate (such as potatoes, corn, or baked beans), ask to have a low-carbohydrate food instead. Ask for a salad or green vegetables.   If you use insulin, check your blood sugar before and after eating out to help you plan how much to eat in the future.   If you eat more carbohydrate at a meal than you had planned, take a walk or do other exercise. This will help lower your blood sugar.  What else should you know?   Limit saturated fat, such as the fat from meat and dairy products. This is a healthy choice because people who have diabetes are at higher risk of heart disease. So choose lean cuts of meat and nonfat or low-fat dairy products. Use olive or canola oil instead of butter or shortening  when cooking.   Don't skip meals. Your blood sugar may drop too low if you skip meals and take insulin or certain medicines for diabetes.   Check with your doctor before you drink alcohol. Alcohol can cause your blood sugar to drop too low. Alcohol can also cause a bad reaction if you take certain diabetes medicines.  Follow-up care is a key part of your treatment and safety. Be sure to make and go to all appointments, and call your doctor if you are having problems. It's also a good idea to know your test results and keep a list of the medicines you take.  Where can you learn more?  Go to [email or web address] and sign in to your MyChart account. Enter I147 in the Search Health Information box to learn more about "Learning About Diabetes Food Guidelines."     If you do not have an account, please click on the "Sign Up Now" link.  Current as of: January 15, 2016  Content Version: 11.7   2006-2018 [employer]. Care instructions adapted under license by [facility]. If you have questions about a medical condition or this instruction, always ask your healthcare professional. [employer], Inc[employer] disclaims any warranty or liability for your use of this information.

## 2016-11-16 NOTE — Progress Notes (Signed)
Subjective:      Patient ID: Gabriel Howell is a 56 y.o. male.    His labs all looked good with a hga1c down to 7.3.  His lipids and bmp was wnl.  His colonoscopy was done in October of 2017.        Review of Systems   HENT: Negative.    Respiratory: Negative.    Cardiovascular: Negative.    Gastrointestinal: Negative.    Genitourinary: Negative.        Objective:   Physical Exam   Constitutional: He is oriented to person, place, and time. He appears well-developed and well-nourished.   HENT:   Head: Normocephalic and atraumatic.   Eyes: Pupils are equal, round, and reactive to light. EOM are normal.   Cardiovascular: Normal rate and regular rhythm.    Pulmonary/Chest: Effort normal and breath sounds normal.   Abdominal: Soft. There is no tenderness.   Musculoskeletal: He exhibits no edema.   Neurological: He is alert and oriented to person, place, and time.   Skin: Skin is warm.   Psychiatric: He has a normal mood and affect.   Nursing note and vitals reviewed.      Assessment:      Diabetes  Hypertension  Hyperlipidemia  Colon polyps         Plan:      To continue work on healthy diet and fitness with weight loss.  Refusing flu shot.  Has modified his diet intake with some benefit noted. Has other routine health care needs.  Follow up 4 months and prn.          Reesa Chew, MD

## 2017-02-11 ENCOUNTER — Encounter: Admit: 2017-02-11 | Discharge: 2017-02-11 | Payer: PRIVATE HEALTH INSURANCE

## 2017-02-11 DIAGNOSIS — E119 Type 2 diabetes mellitus without complications: Secondary | ICD-10-CM

## 2017-02-11 LAB — HEMOGLOBIN A1C: Hemoglobin A1C: 7.8 %

## 2017-02-15 ENCOUNTER — Encounter

## 2017-02-17 NOTE — Progress Notes (Signed)
[place]  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  Per Dr Daria PasturesGrego's order a point of care A1c was done.  Form completed for patient to take back to provider who did his physical.

## 2017-02-22 ENCOUNTER — Ambulatory Visit: Admit: 2017-02-22 | Discharge: 2017-02-22 | Payer: PRIVATE HEALTH INSURANCE | Attending: Family Medicine

## 2017-02-22 DIAGNOSIS — R198 Other specified symptoms and signs involving the digestive system and abdomen: Secondary | ICD-10-CM

## 2017-02-22 NOTE — Progress Notes (Signed)
Subjective:      Patient ID: Gabriel Howell is a 57 y.o. male.    Last hga1c was 7.8  Has problems with loose stools 2-3 times a day.  This has been going on for the past 15 months.  No abdominal pain. This started after his colonoscopy.  Has not tried any intervention.        Diabetes   He presents for his follow-up diabetic visit. He has type 2 diabetes mellitus. No MedicAlert identification noted. There are no hypoglycemic associated symptoms. There are no diabetic associated symptoms. There are no diabetic complications. Risk factors for coronary artery disease include dyslipidemia, diabetes mellitus, hypertension and obesity. Current diabetic treatment includes oral agent (dual therapy). He is compliant with treatment all of the time. His weight is fluctuating minimally. He is following a generally unhealthy diet. He never participates in exercise. An ACE inhibitor/angiotensin II receptor blocker is being taken. He does not see a podiatrist.Eye exam is current.       Review of Systems   Constitutional: Negative.    HENT: Negative.    Respiratory: Negative.    Cardiovascular: Negative.    Gastrointestinal: Negative.    Genitourinary: Negative.    Musculoskeletal: Negative.    Neurological: Negative.    Hematological: Negative.    Psychiatric/Behavioral: Negative.        Objective:   Physical Exam   Constitutional: He is oriented to person, place, and time. He appears well-developed and well-nourished.   HENT:   Head: Normocephalic and atraumatic.   Right Ear: External ear normal.   Left Ear: External ear normal.   Nose: Nose normal.   Mouth/Throat: Oropharynx is clear and moist.   Eyes: Pupils are equal, round, and reactive to light. EOM are normal.   Neck: No thyromegaly present.   Cardiovascular: Normal rate, regular rhythm and intact distal pulses.    No murmur heard.  Pulmonary/Chest: Effort normal and breath sounds normal.   Abdominal: Soft. Bowel sounds are normal.   Musculoskeletal: He exhibits no  edema.   Lymphadenopathy:     He has no cervical adenopathy.   Neurological: He is alert and oriented to person, place, and time.   Skin: Skin is warm and dry.   Nursing note and vitals reviewed.      Assessment:      Diabetes   Hypertension  Hyperlipidemia  Obese  Bowel change        Plan:      Refusing flu shot.  Informed of shingles shot, tdap, and prevnar.    Avoid lactose and decrease gluten.  Start metamucil daily.  If this does not help will hold his metformin and see if improves.  If doing well follow up 4 months.     Gabriel Howell received counseling on the following healthy behaviors: nutrition and exercise  Reviewed prior labs and health maintenance  Continue current medications, diet and exercise.  Discussed use, benefit, and side effects of prescribed medications. Barriers to medication compliance addressed.   Patient given educational materials - see patient instructions  Was a self-tracking handout given in paper form or via MyChart? Yes    Requested Prescriptions      No prescriptions requested or ordered in this encounter       All patient questions answered.  Patient voiced understanding.    Quality Measures    Body mass index is 30.62 kg/m. Elevated. Weight control planned discussed Healthy diet and regular exercise.    BP: 132/80 Blood  pressure is normal. Treatment plan consists of No treatment change needed.    Lab Results   Component Value Date    LDLCHOLESTEROL 74 07/13/2013    (goal LDL reduction with dx if diabetes is 50% LDL reduction)      PHQ Scores 10/05/2016   PHQ2 Score 0   PHQ9 Score 0     Interpretation of Total Score Depression Severity: 1-4 = Minimal depression, 5-9 = Mild depression, 10-14 = Moderate depression, 15-19 = Moderately severe depression, 20-27 = Severe depression          Reesa Chew, MD

## 2017-02-22 NOTE — Patient Instructions (Signed)
Patient Education        Lactose-Restricted Diet: Care Instructions  Your Care Instructions    Lactose is a sugar that is in milk and milk products. Some people do not make enough of an enzyme called lactase, which digests lactose. When this happens it can cause gas, belly pain, diarrhea, and bloating. This is called lactose intolerance. This is not the same as food allergy to milk.  With planning, you can avoid lactose and still eat a tasty and nutritious diet and get enough calcium to maintain healthy bones. Your doctor and dietitian will help you design a diet based on your level of lactose intolerance and what you like to eat. Always talk with your doctor or dietitian before you make changes in your diet.  Follow-up care is a key part of your treatment and safety. Be sure to make and go to all appointments, and call your doctor if you are having problems. It's also a good idea to know your test results and keep a list of the medicines you take.  How can you care for yourself at home?   Limit the amount of milk and milk products in your diet. Spread small amounts of milk or milk products throughout the day, instead of larger amounts all at once.  ? If you have bad symptoms when you eat or drink something with lactose, you may need to avoid it completely.  ? You may be able to drink 1 glass of milk each day, although you may not be able to drink more than a  cup at a time. All types of milk contain the same amount of lactose.  ? If you are not sure whether a milk product causes symptoms, try a small amount and wait to see how you feel before you eat or drink more.   Try yogurt and cheese. These have less lactose than milk and may not cause problems.   Eat or drink milk and milk products that have reduced lactose. In most grocery stores, you can buy milk with reduced lactose, such as Lactaid milk.   Use lactase products. These are dietary supplements that help you digest lactose. Some are pills that you chew  (such as Lactaid) before you eat or drink milk products. Others are liquids that you add to milk 24 hours before you drink it. Try a few products and brands to see which ones work best for you.   Eat or drink other foods, such as soy milk and soy cheese, instead of milk and milk products.   If you are very sensitive to lactose, read labels carefully to spot the lactose products.  ? Some medicines have lactose.  ? Prepared foods that may have lactose include breads, baked goods, breakfast cereals, instant breakfast drinks, instant potatoes, instant soups, baking mixes (such as pancake, cookie, and biscuit mixes), margarine, salad dressings, candies, milk chocolate, and other snacks.  ? Lactose may also be called whey, curds, or milk products.   Be sure to get enough calcium in your diet, especially if you avoid milk products completely. To get enough calcium, you would need to eat calcium-rich foods as often as someone would drink milk. Calcium is very important because it keeps bones strong and reduces the risk of osteoporosis. Ask your dietitian for advice on how to get enough calcium. Foods that have calcium include:  ? Broccoli, spinach, kale, and collard, mustard, and turnip greens.  ? Canned sardines and other small fish that have bones   you can eat.  ? Calcium-fortified orange juice.  ? Soy products such as fortified soy milk and tofu.  ? Almonds.  ? Dried beans.   If you are worried about getting enough nutrients, ask your doctor about taking supplements, such as calcium and vitamin D.  When should you call for help?  Call your doctor now or seek immediate medical care if:    You have new or worse belly pain.   Watch closely for changes in your health, and be sure to contact your doctor if:    You do not get better as expected.   Where can you learn more?  Go to [email or web address] and sign in to your MyChart account. Enter S453 in the Search Health Information box to learn more  about "Lactose-Restricted Diet: Care Instructions."     If you do not have an account, please click on the "Sign Up Now" link.  Current as of: August 05, 2016  Content Version: 11.8   2006-2018 [employer]. Care instructions adapted under license by [facility]. If you have questions about a medical condition or this instruction, always ask your healthcare professional. [employer], [employer] disclaims any warranty or liability for your use of this information.         Patient Education        Gluten-Free Diet: Care Instructions  Your Care Instructions    To help your symptoms, your doctor has recommended a gluten-free diet. This means not eating foods that have gluten in them. Gluten is a kind of protein. It's found in wheat, barley, and rye.  If you eat a gluten-free diet, you can help manage your symptoms and prevent long-term problems. You can also get all the nutrition you need.  Follow-up care is a key part of your treatment and safety. Be sure to make and go to all appointments, and call your doctor if you are having problems. It's also a good idea to know your test results and keep a list of the medicines you take.  How can you care for yourself at home?   Don't eat any foods that have gluten in them. These include bagels, bread, crackers, and some cereals. They also include pasta and pizza.   Carefully read food labels. Look for wheat or wheat products in ice cream and candy. You may also find them in salad dressing, canned and frozen soups and vegetables, and other processed foods.   Avoid all beer products unless the label says they are gluten-free. Beers with and without alcohol have gluten unless the labels say they are gluten-free. This includes lagers, ales, and stouts.   Avoid oats, at least at first. Oats may cause symptoms in some people, perhaps as a result of contamination with wheat, barley, or rye during processing. But many people who have celiac disease can eat moderate  amounts of oats without having symptoms. Health professionals vary in their long-term recommendations regarding eating foods with oats. But most agree it is safe to eat oats labeled as gluten-free.   When you eat out, look for restaurants that serve gluten-free food. You can also ask if the chef is familiar with gluten-free cooking.   Try to learn more about gluten-free options. Find grocery stores that sell gluten-free pizza and other foods. If you have access to the Internet, look online for gluten-free foods and recipes.   On a gluten-free eating plan, it's okay to have:  ? Eggs and dairy products. (But some dairy products may make your   symptoms worse. Ask your doctor if you have questions about dairy products. Read ingredient labels carefully. Some processed cheeses contain gluten.)  ? Flours and foods made with amaranth, arrowroot, beans, buckwheat, corn, cornmeal, flax, millet, potatoes, gluten-free nut and oat bran, quinoa, rice, sorghum, soybeans, tapioca, or teff.  ? Fresh, frozen, or canned unprocessed meats. But avoid processed meats. Some examples of processed meats to avoid are hot dogs, salami, and deli meat. Read labels for additives that may contain gluten.  ? Fresh, frozen, dried, or canned fruits and vegetables, if they do not have thickeners or other additives that contain gluten.  ? Some alcohol drinks. These include wine, liqueurs, and ciders. They also include liquor like whiskey and brandy.  When should you call for help?  Watch closely for changes in your health, and be sure to contact your doctor if:    You have unexplained weight loss.     You have diarrhea that lasts longer than 1 to 2 weeks.     You have unusual fatigue or mood changes, especially if these last more than a week and are not related to any other illness, such as the flu.     Your symptoms come back again.     Your stomach pain gets worse.   Where can you learn more?  Go to  [email or web address] and sign in to your MyChart account. Enter O549 in the Search Health Information box to learn more about "Gluten-Free Diet: Care Instructions."     If you do not have an account, please click on the "Sign Up Now" link.  Current as of: May 06, 2016  Content Version: 11.8   2006-2018 [employer]. Care instructions adapted under license by [facility]. If you have questions about a medical condition or this instruction, always ask your healthcare professional. [employer] disclaims any warranty or liability for your use of this information.

## 2017-03-24 ENCOUNTER — Encounter: Attending: Family Medicine

## 2017-05-24 ENCOUNTER — Ambulatory Visit: Admit: 2017-05-24 | Discharge: 2017-05-24 | Payer: PRIVATE HEALTH INSURANCE | Attending: Critical Care Medicine

## 2017-05-24 DIAGNOSIS — J019 Acute sinusitis, unspecified: Secondary | ICD-10-CM

## 2017-05-24 MED ORDER — AZITHROMYCIN 250 MG PO TABS
250 MG | ORAL_TABLET | ORAL | 0 refills | Status: AC
Start: 2017-05-24 — End: 2017-05-29

## 2017-05-24 NOTE — Progress Notes (Signed)
[facility]  [address] [place] [address]  Phone: [phone]  Fax: [phone]      Date: 05/24/2017   Patient:  Gabriel Howell   Date of Birth: 11-14-1960 Age: 57 y.o.  MRN: [id number]   PCP: Reesa Chew, MD       Subjective:    Chief Complaint   Patient presents with   ??? Congestion     in chest with some cough since Sat.  Taking generic nyquil.   ??? Sinus Problem     pressure, slight sore throat.  Prefers any prescriptions today to be printed.       HPI: Patient presents with complaints of body aches, cough, and chest congestion for the past 3 days. He also has had sinus pressure, post nasal drainage, and a mild sore throat. He has tried nyquil with some relief. He denies fevers, otalgia, nausea, emesis, or diarrhea. His cough is occasionally productive of yellow/green phlegm. His nasal drainage has been clear to white. His wife had pneumonia last week. He did not get an influenza vaccine this season.     History is obtained from the patient and previous medical records.   All other review of systems negative.     No Known Allergies    Current Outpatient Medications   Medication Sig Dispense Refill   ??? azithromycin (ZITHROMAX) 250 MG tablet Take 1 tablet by mouth See Admin Instructions for 5 days 500mg  on day 1 followed by 250mg  on days 2 - 5 6 tablet 0   ??? aspirin 81 MG tablet Take 81 mg by mouth daily     ??? simvastatin (ZOCOR) 40 MG tablet Take 1 tablet by mouth nightly 90 tablet 3   ??? losartan-hydrochlorothiazide (HYZAAR) 100-25 MG per tablet Take 1 tablet by mouth daily 90 tablet 3   ??? metFORMIN (GLUCOPHAGE) 1000 MG tablet Take 1 tablet by mouth 2 times daily (with meals) 180 tablet 3   ??? pioglitazone (ACTOS) 45 MG tablet Take 1 tablet by mouth daily 90 tablet 3     No current facility-administered medications for this visit.          Past Medical History:   Diagnosis Date   ??? Hyperlipidemia    ??? Hypertension    ??? Type 2 diabetes mellitus without complication (HCC)        Social  History     Tobacco Use   ??? Smoking status: Never Smoker   ??? Smokeless tobacco: Never Used   Substance Use Topics   ??? Alcohol use: Yes     Comment: one beer per week   ??? Drug use: No       Significant family and surgical history reviewed as noted in the patient's record.      Objective:    Physical Exam:  Vitals: BP 120/76 (Site: Right Upper Arm, Position: Sitting, Cuff Size: Medium Adult)    Pulse 88    Temp 98.5 ??F (36.9 ??C) (Tympanic)    Ht 5\' 6"  (1.676 m)    Wt 184 lb 3.2 oz (83.6 kg)    SpO2 96%    BMI 29.73 kg/m??     LABS:  CBC:  No results for input(s): WBC, HGB, PLT in the last 72 hours.  BMP:  No results for input(s): NA, K, CL, CO2, BUN, CREATININE, GLUCOSE in the last 72 hours.  Hepatic:  No results for input(s): AST, ALT, ALB, BILITOT, ALKPHOS in the last 72 hours.  Pertinent lab and radiology results reviewed.     General Appearance: alert and oriented to person, place and time, well developed and well- nourished, in no acute distress  Skin: warm and dry, no rash or erythema  Head: normocephalic and atraumatic  Eyes: pupils equal, round, and reactive to light, conjunctivae normal  ENT: tympanic membrane, external ear and ear canal normal bilaterally, nose without deformity, nasal mucosa and turbinates congested-- frontal sinus are tender to palpation, pharynx with erythema and edema bilaterally  Neck: supple and non-tender without mass, no thyromegaly or thyroid nodules, with cervical lymphadenopathy  Pulmonary/Chest: clear to auscultation bilaterally- no wheezes, rales or rhonchi, normal air movement, no respiratory distress-- no cough noted during office visit  Cardiovascular: normal rate, regular rhythm, normal S1 and S2, no murmurs, rubs, clicks, or gallops  Abdomen: soft, non-tender, non-distended, normal bowel sounds, no masses or organomegaly  Extremities: no cyanosis, clubbing or edema  Musculoskeletal: normal range of motion, no joint swelling, deformity or tenderness  Neurologic: no gross  cranial nerve deficit, gait, coordination and speech normal      Assessment and Plan:  Visit Diagnoses       Codes    Acute bacterial sinusitis    -  Primary J01.90, B96.89    Cough     R05    Sinus congestion     R09.81        Encounter Diagnoses   Name Primary?   ??? Acute bacterial sinusitis Yes   ??? Cough    ??? Sinus congestion        Azithromycin as prescribed  May use saline or flonase nasal spray if desired for sinus congestion  Follow up as needed.  Return or go to an urgent care or emergency room if symptoms worsen, fail to improve, or new symptoms arise.    The use, risks, benefits, and side effects of prescribed or recommended medications were discussed. All questions were answered and the patient voiced understanding.       Electronically signed by Lovenia KimMichelle Jansen Goodpasture APRN, NP-C, FNP-BC on 05/24/2017 at 11:02 AM  Internal Medicine

## 2017-05-30 ENCOUNTER — Ambulatory Visit: Admit: 2017-05-30 | Discharge: 2017-05-30 | Payer: PRIVATE HEALTH INSURANCE | Attending: Family

## 2017-05-30 DIAGNOSIS — H66003 Acute suppurative otitis media without spontaneous rupture of ear drum, bilateral: Secondary | ICD-10-CM

## 2017-05-30 MED ORDER — CEFDINIR 300 MG PO CAPS
300 MG | ORAL_CAPSULE | Freq: Two times a day (BID) | ORAL | 0 refills | Status: AC
Start: 2017-05-30 — End: 2017-06-09

## 2017-05-30 NOTE — Progress Notes (Signed)
[facility]   Lucita Ferrara, APRN-CNP  [address]  Phone:  [phone]  Fax:  [phone]  Gabriel Howell is a 57 y.o. male who presents today for his medical conditions/complaints as noted below.  Su Monks c/o of Ear Fullness (Feel irritated) and Cough (with some congestion.  Took last pill on Sat am.  Wants prescripion printed if anything is needed.)      HPI:     Ear Fullness    There is pain in both ears. This is a new problem. The current episode started in the past 7 days (2 days). The problem has been gradually worsening. There has been no fever. The patient is experiencing no pain. Associated symptoms include hearing loss (muffling of the hearing) and rhinorrhea. Pertinent negatives include no ear discharge or sore throat. Treatments tried: zithromax. The treatment provided no relief.       Wt Readings from Last 3 Encounters:   05/30/17 185 lb (83.9 kg)   05/24/17 184 lb 3.2 oz (83.6 kg)   02/22/17 184 lb (83.5 kg)       Temp Readings from Last 3 Encounters:   05/30/17 98.8 F (37.1 C) (Tympanic)   05/24/17 98.5 F (36.9 C) (Tympanic)       BP Readings from Last 3 Encounters:   05/30/17 110/70   05/24/17 120/76   02/22/17 132/80       Pulse Readings from Last 3 Encounters:   05/30/17 92   05/24/17 88   02/22/17 92              Past Medical History:   Diagnosis Date   . Hyperlipidemia    . Hypertension    . Type 2 diabetes mellitus without complication [facility])       Past Surgical History:   Procedure Laterality Date   . COLONOSCOPY  10/2015    polyps- repeat in a year- Dr Leary [facility]   . MALIGNANT SKIN LESION EXCISION Right 1999    [facility]     History reviewed. No pertinent family history.  Social History     Tobacco Use   . Smoking status: Never Smoker   . Smokeless tobacco: Never Used   Substance Use Topics   . Alcohol use: Yes     Comment: one beer per week      Current Outpatient Medications   Medication Sig Dispense  Refill   . cefdinir (OMNICEF) 300 MG capsule Take 1 capsule by mouth 2 times daily for 10 days 20 capsule 0   . aspirin 81 MG tablet Take 81 mg by mouth daily     . simvastatin (ZOCOR) 40 MG tablet Take 1 tablet by mouth nightly 90 tablet 3   . losartan-hydrochlorothiazide (HYZAAR) 100-25 MG per tablet Take 1 tablet by mouth daily 90 tablet 3   . metFORMIN (GLUCOPHAGE) 1000 MG tablet Take 1 tablet by mouth 2 times daily (with meals) 180 tablet 3   . pioglitazone (ACTOS) 45 MG tablet Take 1 tablet by mouth daily 90 tablet 3     No current facility-administered medications for this visit.      No Known Allergies    No exam data present    Subjective:      Review of Systems   HENT: Positive for hearing loss (muffling of the hearing) and rhinorrhea. Negative for ear discharge and sore throat.        Objective:  BP 110/70 (Site: Left Upper Arm, Position: Sitting, Cuff Size: Medium Adult)   Pulse 92   Temp 98.8 F (37.1 C) (Tympanic)   Ht 5\' 6"  (1.676 m)   Wt 185 lb (83.9 kg)   SpO2 96%   BMI 29.86 kg/m     Physical Exam   Constitutional: He appears well-developed and well-nourished. No distress.   HENT:   Head: Normocephalic.   Right Ear: External ear normal. Tympanic membrane is erythematous and bulging.   Left Ear: External ear normal. Tympanic membrane is erythematous and bulging.   Nose: Mucosal edema present.   Mouth/Throat: Uvula is midline, oropharynx is clear and moist and mucous membranes are normal. No oropharyngeal exudate. No tonsillar exudate.   Cardiovascular: Normal rate and regular rhythm.   Pulmonary/Chest: Effort normal and breath sounds normal.   Neurological: He is alert.   Skin: Skin is dry. Capillary refill takes less than 2 seconds. He is not diaphoretic.       Assessment:      Diagnosis Orders   1. Acute suppurative otitis media of both ears without spontaneous rupture of tympanic membranes, recurrence not specified  cefdinir (OMNICEF) 300 MG capsule     No results found for this visit  on 05/30/17.          Plan:       Omnicef twice daily.  Follow up with primary care provider in 1 to 2 days.            Patient Instructions     Omnicef twice daily.  Follow up with primary care provider in 1 to 2 days.    Patient Education        Ear Infection (Otitis Media): Care Instructions  Your Care Instructions    An ear infection may start with a cold and affect the middle ear (otitis media). It can hurt a lot. Most ear infections clear up on their own in a couple of days. Most often you will not need antibiotics. This is because many ear infections are caused by a virus. Antibiotics don't work against a virus. Regular doses of pain medicines are the best way to reduce your fever and help you feel better.  Follow-up care is a key part of your treatment and safety. Be sure to make and go to all appointments, and call your doctor if you are having problems. It's also a good idea to know your test results and keep a list of the medicines you take.  How can you care for yourself at home?   Take pain medicines exactly as directed.  ? If the doctor gave you a prescription medicine for pain, take it as prescribed.  ? If you are not taking a prescription pain medicine, take an over-the-counter medicine, such as acetaminophen (Tylenol), ibuprofen (Advil, Motrin), or naproxen (Aleve). Read and follow all instructions on the label.  ? Do not take two or more pain medicines at the same time unless the doctor told you to. Many pain medicines have acetaminophen, which is Tylenol. Too much acetaminophen (Tylenol) can be harmful.   Plan to take a full dose of pain reliever before bedtime. Getting enough sleep will help you get better.   Try a warm, moist washcloth on the ear. It may help relieve pain.   If your doctor prescribed antibiotics, take them as directed. Do not stop taking them just because you feel better. You need to take the full course of antibiotics.  When should you call  for help?  Call your doctor now  or seek immediate medical care if:    You have new or increasing ear pain.     You have new or increasing pus or blood draining from your ear.     You have a fever with a stiff neck or a severe headache.   Watch closely for changes in your health, and be sure to contact your doctor if:    You have new or worse symptoms.     You are not getting better after taking an antibiotic for 2 days.   Where can you learn more?  Go to [email or web address] and sign in to your MyChart account. Enter [phone] in the Search Health Information box to learn more about "Ear Infection (Otitis Media): Care Instructions."     If you do not have an account, please click on the "Sign Up Now" link.  Current as of: May 04, 2016  Content Version: 11.9   2006-2018 [employer]. Care instructions adapted under license by [facility]. If you have questions about a medical condition or this instruction, always ask your healthcare professional. [employer], Inc[employer] disclaims any warranty or liability for your use of this information.           Patient/Caregiver instructed on use, benefit, and side effects of prescribed medications.  All patient/parent/caregiver questions answered.  Patient/parent/caregiver voiced understanding. Reviewed health maintenance.  Instructed to continue current medications, diet and exercise.  Patient agreed with treatment plan. Follow up as directed.           Electronically signed by Collins Scotland, APRN - CNP on4/22/2019

## 2017-05-30 NOTE — Patient Instructions (Signed)
Omnicef twice daily.  Follow up with primary care provider in 1 to 2 days.    Patient Education        Ear Infection (Otitis Media): Care Instructions  Your Care Instructions    An ear infection may start with a cold and affect the middle ear (otitis media). It can hurt a lot. Most ear infections clear up on their own in a couple of days. Most often you will not need antibiotics. This is because many ear infections are caused by a virus. Antibiotics don't work against a virus. Regular doses of pain medicines are the best way to reduce your fever and help you feel better.  Follow-up care is a key part of your treatment and safety. Be sure to make and go to all appointments, and call your doctor if you are having problems. It's also a good idea to know your test results and keep a list of the medicines you take.  How can you care for yourself at home?  ?? Take pain medicines exactly as directed.  ? If the doctor gave you a prescription medicine for pain, take it as prescribed.  ? If you are not taking a prescription pain medicine, take an over-the-counter medicine, such as acetaminophen (Tylenol), ibuprofen (Advil, Motrin), or naproxen (Aleve). Read and follow all instructions on the label.  ? Do not take two or more pain medicines at the same time unless the doctor told you to. Many pain medicines have acetaminophen, which is Tylenol. Too much acetaminophen (Tylenol) can be harmful.  ?? Plan to take a full dose of pain reliever before bedtime. Getting enough sleep will help you get better.  ?? Try a warm, moist washcloth on the ear. It may help relieve pain.  ?? If your doctor prescribed antibiotics, take them as directed. Do not stop taking them just because you feel better. You need to take the full course of antibiotics.  When should you call for help?  Call your doctor now or seek immediate medical care if:  ?? ?? You have new or increasing ear pain.   ?? ?? You have new or increasing pus or blood draining from your ear.    ?? ?? You have a fever with a stiff neck or a severe headache.   ??Watch closely for changes in your health, and be sure to contact your doctor if:  ?? ?? You have new or worse symptoms.   ?? ?? You are not getting better after taking an antibiotic for 2 days.   Where can you learn more?  Go to [email or web address] and sign in to your MyChart account. Enter [id number] in the Search Health Information box to learn more about "Ear Infection (Otitis Media): Care Instructions."     If you do not have an account, please click on the "Sign Up Now" link.  Current as of: May 04, 2016  Content Version: 11.9  ?? 2006-2018 [employer]. Care instructions adapted under license by [facility]. If you have questions about a medical condition or this instruction, always ask your healthcare professional. [employer] disclaims any warranty or liability for your use of this information.

## 2017-06-23 ENCOUNTER — Ambulatory Visit: Admit: 2017-06-23 | Discharge: 2017-06-23 | Payer: PRIVATE HEALTH INSURANCE | Attending: Family Medicine

## 2017-06-23 DIAGNOSIS — E119 Type 2 diabetes mellitus without complications: Secondary | ICD-10-CM

## 2017-06-23 LAB — POCT GLYCOSYLATED HEMOGLOBIN (HGB A1C): Hemoglobin A1C: 7.5 %

## 2017-06-23 NOTE — Patient Instructions (Addendum)
Patient Education      need eye exam      Learning About Diabetes Food Guidelines  Your Care Instructions    Meal planning is important to manage diabetes. It helps keep your blood sugar at a target level (which you set with your doctor). You don't have to eat special foods. You can eat what your family eats, including sweets once in a while. But you do have to pay attention to how often you eat and how much you eat of certain foods.  You may want to work with a dietitian or a certified diabetes educator (CDE) to help you plan meals and snacks. A dietitian or CDE can also help you lose weight if that is one of your goals.  What should you know about eating carbs?  Managing the amount of carbohydrate (carbs) you eat is an important part of healthy meals when you have diabetes. Carbohydrate is found in many foods.  ?? Learn which foods have carbs. And learn the amounts of carbs in different foods.  ? Bread, cereal, pasta, and rice have about 15 grams of carbs in a serving. A serving is 1 slice of bread (1 ounce), ?? cup of cooked cereal, or 1/3 cup of cooked pasta or rice.  ? Fruits have 15 grams of carbs in a serving. A serving is 1 small fresh fruit, such as an apple or orange; ?? of a banana; ?? cup of cooked or canned fruit; ?? cup of fruit juice; 1 cup of melon or raspberries; or 2 tablespoons of dried fruit.  ? Milk and no-sugar-added yogurt have 15 grams of carbs in a serving. A serving is 1 cup of milk or 2/3 cup of no-sugar-added yogurt.  ? Starchy vegetables have 15 grams of carbs in a serving. A serving is ?? cup of mashed potatoes or sweet potato; 1 cup winter squash; ?? of a small baked potato; ?? cup of cooked beans; or ?? cup cooked corn or green peas.  ?? Learn how much carbs to eat each day and at each meal. A dietitian or CDE can teach you how to keep track of the amount of carbs you eat. This is called carbohydrate counting.  ?? If you are not sure how to count carbohydrate grams, use the Plate Method to  plan meals. It is a good, quick way to make sure that you have a balanced meal. It also helps you spread carbs throughout the day.  ? Divide your plate by types of foods. Put non-starchy vegetables on half the plate, meat or other protein food on one-quarter of the plate, and a grain or starchy vegetable in the final quarter of the plate. To this you can add a small piece of fruit and 1 cup of milk or yogurt, depending on how many carbs you are supposed to eat at a meal.  ?? Try to eat about the same amount of carbs at each meal. Do not "save up" your daily allowance of carbs to eat at one meal.  ?? Proteins have very little or no carbs per serving. Examples of proteins are beef, chicken, [place], fish, eggs, tofu, cheese, cottage cheese, and peanut butter. A serving size of meat is 3 ounces, which is about the size of a deck of cards. Examples of meat substitute serving sizes (equal to 1 ounce of meat) are 1/4 cup of cottage cheese, 1 egg, 1 tablespoon of peanut butter, and ?? cup of tofu.  How can you eat  out and still eat healthy?  ?? Learn to estimate the serving sizes of foods that have carbohydrate. If you measure food at home, it will be easier to estimate the amount in a serving of restaurant food.  ?? If the meal you order has too much carbohydrate (such as potatoes, corn, or baked beans), ask to have a low-carbohydrate food instead. Ask for a salad or green vegetables.  ?? If you use insulin, check your blood sugar before and after eating out to help you plan how much to eat in the future.  ?? If you eat more carbohydrate at a meal than you had planned, take a walk or do other exercise. This will help lower your blood sugar.  What else should you know?  ?? Limit saturated fat, such as the fat from meat and dairy products. This is a healthy choice because people who have diabetes are at higher risk of heart disease. So choose lean cuts of meat and nonfat or low-fat dairy products. Use olive or canola oil instead of  butter or shortening when cooking.  ?? Don't skip meals. Your blood sugar may drop too low if you skip meals and take insulin or certain medicines for diabetes.  ?? Check with your doctor before you drink alcohol. Alcohol can cause your blood sugar to drop too low. Alcohol can also cause a bad reaction if you take certain diabetes medicines.  Follow-up care is a key part of your treatment and safety. Be sure to make and go to all appointments, and call your doctor if you are having problems. It's also a good idea to know your test results and keep a list of the medicines you take.  Where can you learn more?  Go to [email or web address] and sign in to your MyChart account. Enter [phone] in the Search Health Information box to learn more about "Learning About Diabetes Food Guidelines."     If you do not have an account, please click on the "Sign Up Now" link.  Current as of: September 01, 2016  Content Version: 12.0  ?? 2006-2019 [employer], [employer]. Care instructions adapted under license by [facility]. If you have questions about a medical condition or this instruction, always ask your healthcare professional. [employer], [employer] disclaims any warranty or liability for your use of this information.         Patient Education        Learning About Diabetes and Exercise  Can you exercise if you have diabetes?  When you have diabetes, it's important to get regular exercise. This helps control your blood sugar level. You can still play sports, run, ride a bike, go swimming, and do other activities when you have diabetes.  How can exercise help you manage diabetes?  Your body turns the food you eat into glucose, a type of sugar. You need this sugar for energy. When you have diabetes, the sugar builds up in your blood. But when you exercise, your body uses sugar. This helps keep it from building up in your blood and results in lower blood sugar and better control of diabetes.  Exercise may help you in  other ways too. It can help you reach and stay at a healthy weight. It also helps improve blood pressure and cholesterol, which can reduce the risk of heart disease.  Exercise can make you feel stronger and happier. It can help you relax and sleep better, and give you confidence in other things you do.  How can you  exercise safely?  Before you start a new exercise program, talk to your doctor about how and when to exercise. You may need to have a medical exam and tests before you begin. Some types of exercise can be harmful if your diabetes is causing other problems, such as problems with your feet. Your doctor can tell you what types of exercise are good choices for you.  These tips can help you exercise safely when you have diabetes. If your diabetes is controlled by diet or medicine that doesn't lower your blood sugar, you don't need to eat a snack before you exercise.  ?? Check your blood sugar before you exercise. And be careful about what you eat.  ? If your blood sugar is less than 100, eat a carbohydrate snack before you exercise.  ? Be careful when you exercise if your blood sugar is over 300. High blood sugar can make you dehydrated. And that makes your blood sugar levels go even higher. If you have ketones in your blood or urine and your blood sugar is over 300, do not exercise.  ?? Don't try to do too much at first. Build up your exercise program bit by bit. Try to get at least 30 minutes of exercise on most days of the week. Walking is a good choice. You also may want to do other activities, such as riding a bike or swimming. You might try running or gardening. Try to include muscle-strengthening exercises at least 2 times a week. These exercises include push-ups and weight training. You can also use rubber tubing or stretch bands. You stretch or pull the tubing or band to build muscle strength. If you want to exercise more, slowly increase how hard or long you exercise.  ?? You may get symptoms of low  blood sugar during exercise or up to 24 hours later. Some symptoms of low blood sugar, such as sweating, a fast heartbeat, or feeling tired, can be confused with what can happen anytime you exercise. Other symptoms may include feeling anxious, dizzy, weak, or shaky. So it's a good idea to check your blood sugar again.  ?? You can treat low blood sugar by eating or drinking something that has 15 grams of carbohydrate. These should be quick-sugar foods. Quick-sugar foods such as fruit juice, regular (not diet) soda, glucose tablets, hard candy, or raisins can help raise blood sugar. Check your blood sugar level again 15 minutes after having a quick-sugar food to make sure your level is getting back to your target range.  ?? Drink plenty of water before, during, and after you exercise.  ?? Wear medical alert jewelry that says you have diabetes. You can buy this at most drugstores.  ?? Pay attention to your body. If you are used to exercise and notice that you can't do as much as usual, talk to your doctor.  Follow-up care is a key part of your treatment and safety. Be sure to make and go to all appointments, and call your doctor if you are having problems. It's also a good idea to know your test results and keep a list of the medicines you take.  Where can you learn more?  Go to [email or web address] and sign in to your MyChart account. Enter [phone] in the Search Health Information box to learn more about "Learning About Diabetes and Exercise."     If you do not have an account, please click on the "Sign Up Now" link.  Current as of: September 01, 2016  Content Version: 12.0  ?? 2006-2019 [employer], [employer]. Care instructions adapted under license by [facility]. If you have questions about a medical condition or this instruction, always ask your healthcare professional. [employer], [employer] disclaims any warranty or liability for your use of this information.

## 2017-06-23 NOTE — Progress Notes (Signed)
Subjective:      Patient ID: Gabriel Howell is a 57 y.o. male.    Diabetes   He presents for his follow-up diabetic visit. He has type 2 diabetes mellitus. No MedicAlert identification noted. There are no hypoglycemic associated symptoms. There are no diabetic associated symptoms. There are no hypoglycemic complications. There are no diabetic complications. Risk factors for coronary artery disease include diabetes mellitus, dyslipidemia, hypertension and obesity. Current diabetic treatment includes oral agent (dual therapy). He is compliant with treatment all of the time. He is following a generally unhealthy diet. He never participates in exercise. An ACE inhibitor/angiotensin II receptor blocker is being taken. He does not see a podiatrist.Eye exam is not current.       Review of Systems   Constitutional: Negative.    HENT: Positive for congestion.    Respiratory: Negative.    Cardiovascular: Negative.    Gastrointestinal: Negative.    Genitourinary: Positive for decreased urine volume.   Musculoskeletal: Positive for arthralgias.   Skin: Negative.    Neurological: Negative.    Hematological: Negative.    Psychiatric/Behavioral: Negative.        Objective:   Physical Exam   Constitutional: He is oriented to person, place, and time. He appears well-developed and well-nourished.   HENT:   Head: Normocephalic and atraumatic.   Right Ear: External ear normal.   Nose: Nose normal.   Mouth/Throat: Oropharynx is clear and moist.   Left tm has some retraction no inflammation or fluid  Nasal mucosa inflamed with clear drainage    Eyes: Pupils are equal, round, and reactive to light. EOM are normal.   Neck: Normal range of motion. Neck supple.   Cardiovascular: Normal rate and regular rhythm.   No murmur heard.  Pulmonary/Chest: Effort normal and breath sounds normal.   Abdominal: Soft.   Musculoskeletal: He exhibits no edema.   Neurological: He is alert and oriented to person, place, and time.   Skin: Skin is warm  and dry.   Psychiatric: He has a normal mood and affect.   Nursing note and vitals reviewed.    hga1c 7.5  Assessment:      Diabetes  Hypertension  Hyperlipidemia  Rhinitis       Plan:      Need eye exam.  flonase 2 sprays daily.   Follow up 4 months for annual    Larence received counseling on the following healthy behaviors: nutrition and exercise  Reviewed prior labs and health maintenance  Continue current medications, diet and exercise.  Discussed use, benefit, and side effects of prescribed medications. Barriers to medication compliance addressed.   Patient given educational materials - see patient instructions  Was a self-tracking handout given in paper form or via MyChart? Yes    Requested Prescriptions      No prescriptions requested or ordered in this encounter       All patient questions answered.  Patient voiced understanding.    Quality Measures    Body mass index is 29.54 kg/m??. Elevated. Weight control planned discussed Healthy diet and regular exercise.    BP: 128/70 Blood pressure is normal. Treatment plan consists of No treatment change needed.    Lab Results   Component Value Date    LDLCHOLESTEROL 74 07/13/2013    (goal LDL reduction with dx if diabetes is 50% LDL reduction)      PHQ Scores 05/24/2017 10/05/2016   PHQ2 Score 0 0   PHQ9 Score 0 0  Interpretation of Total Score Depression Severity: 1-4 = Minimal depression, 5-9 = Mild depression, 10-14 = Moderate depression, 15-19 = Moderately severe depression, 20-27 = Severe depression          Reesa Chew, MD

## 2017-08-16 NOTE — Telephone Encounter (Signed)
Pt aware

## 2017-08-16 NOTE — Telephone Encounter (Signed)
Pt stopped requesting a letter stating you are treating him for his high blood pressure. This letter is needed before he has a physical at [facility] on Thursday 08/18/17.  Please call him at [phone] when ready for him to pick up.

## 2017-08-16 NOTE — Telephone Encounter (Signed)
Letter done

## 2017-09-29 NOTE — Telephone Encounter (Signed)
Gabriel MonksWilliam Joseph Howell is requesting a refill on the following medication(s):  Requested Prescriptions     Pending Prescriptions Disp Refills   ??? pioglitazone (ACTOS) 45 MG tablet [Pharmacy Med Name: Pioglitazone HCl Oral Tablet 45 MG] 90 tablet 2     Sig: TAKE 1 TABLET BY MOUTH ONE TIME A DAY   ??? losartan-hydrochlorothiazide (HYZAAR) 100-25 MG per tablet [Pharmacy Med Name: Losartan Potassium-HCTZ Oral Tablet 100-25 MG] 90 tablet 2     Sig: TAKE 1 TABLET BY MOUTH ONE TIME A DAY   ??? simvastatin (ZOCOR) 40 MG tablet [Pharmacy Med Name: Simvastatin Oral Tablet 40 MG] 90 tablet 2     Sig: TAKE 1 TABLET BY MOUTH IN THE EVENING       Last Visit Date (If Applicable):  06/23/2017    Next Visit Date:    11/08/2017

## 2017-09-30 MED ORDER — LOSARTAN POTASSIUM-HCTZ 100-25 MG PO TABS
100-25 MG | ORAL_TABLET | ORAL | 2 refills | Status: DC
Start: 2017-09-30 — End: 2018-07-11

## 2017-09-30 MED ORDER — PIOGLITAZONE HCL 45 MG PO TABS
45 MG | ORAL_TABLET | ORAL | 2 refills | Status: DC
Start: 2017-09-30 — End: 2018-07-04

## 2017-09-30 MED ORDER — SIMVASTATIN 40 MG PO TABS
40 MG | ORAL_TABLET | ORAL | 2 refills | Status: DC
Start: 2017-09-30 — End: 2018-04-11

## 2017-11-08 ENCOUNTER — Encounter: Payer: PRIVATE HEALTH INSURANCE | Attending: Family Medicine

## 2017-12-19 MED ORDER — METFORMIN HCL 1000 MG PO TABS
1000 MG | ORAL_TABLET | ORAL | 2 refills | Status: DC
Start: 2017-12-19 — End: 2018-07-17

## 2017-12-19 NOTE — Telephone Encounter (Signed)
Su Monks is requesting a refill on the following medication(s):  Requested Prescriptions     Pending Prescriptions Disp Refills   ??? metFORMIN (GLUCOPHAGE) 1000 MG tablet [Pharmacy Med Name: metFORMIN HCl Oral Tablet 1000 MG] 180 tablet 2     Sig: TAKE 1 TABLET BY MOUTH TWO TIMES A DAY WITH MEALS       Last Visit Date (If Applicable):  06/23/2017    Next Visit Date:    Visit date not found

## 2018-03-02 ENCOUNTER — Encounter: Admit: 2018-03-02 | Discharge: 2018-03-02 | Primary: Nurse Practitioner

## 2018-04-11 MED ORDER — SIMVASTATIN 40 MG PO TABS
40 MG | ORAL_TABLET | ORAL | 0 refills | Status: DC
Start: 2018-04-11 — End: 2018-07-17

## 2018-04-11 NOTE — Telephone Encounter (Signed)
Gabriel Howell is requesting a refill on the following medication(s):  Requested Prescriptions     Pending Prescriptions Disp Refills   . simvastatin (ZOCOR) 40 MG tablet [Pharmacy Med Name: Simvastatin Oral Tablet 40 MG] 90 tablet 0     Sig: TAKE 1 TABLET BY MOUTH IN THE EVENING       Last Visit Date (If Applicable):  06/23/2017    Next Visit Date:    Visit date not found

## 2018-07-04 MED ORDER — PIOGLITAZONE HCL 45 MG PO TABS
45 MG | ORAL_TABLET | ORAL | 2 refills | Status: DC
Start: 2018-07-04 — End: 2018-07-17

## 2018-07-04 NOTE — Telephone Encounter (Signed)
Need follow up

## 2018-07-04 NOTE — Telephone Encounter (Signed)
Message left for patient to contact office to schedule f/u

## 2018-07-04 NOTE — Telephone Encounter (Signed)
appt made 07/11/18- pt wants to know if he can have enough sent in to get to his appt, he is completely out

## 2018-07-04 NOTE — Telephone Encounter (Signed)
Script sent

## 2018-07-04 NOTE — Telephone Encounter (Signed)
Su Monks is requesting a refill on the following medication(s):  Requested Prescriptions     Pending Prescriptions Disp Refills   ??? pioglitazone (ACTOS) 45 MG tablet [Pharmacy Med Name: Pioglitazone HCl Oral Tablet 45 MG] 90 tablet 0     Sig: TAKE 1 TABLET BY MOUTH ONE TIME A DAY       Last Visit Date (If Applicable):  06/23/2017    Next Visit Date:    Visit date not found

## 2018-07-10 NOTE — Telephone Encounter (Signed)
Gabriel Howell is requesting a refill on the following medication(s):  Requested Prescriptions     Pending Prescriptions Disp Refills   ??? losartan (COZAAR) 100 MG tablet [Pharmacy Med Name: Losartan Potassium Oral Tablet 100 MG] 90 tablet 0     Sig: TAKE 1 TABLET BY MOUTH ONE TIME A DAY   ??? hydroCHLOROthiazide (HYDRODIURIL) 25 MG tablet [Pharmacy Med Name: hydroCHLOROthiazide Oral Tablet 25 MG] 90 tablet 0     Sig: TAKE 1 TABLET BY MOUTH ONE TIME A DAY   ??? losartan-hydrochlorothiazide (HYZAAR) 100-25 MG per tablet [Pharmacy Med Name: Losartan Potassium-HCTZ Oral Tablet 100-25 MG] 90 tablet 0     Sig: TAKE 1 TABLET BY MOUTH ONE TIME A DAY       Last Visit Date (If Applicable):  06/23/2017    Next Visit Date:    07/17/2018

## 2018-07-11 ENCOUNTER — Encounter: Attending: Family Medicine | Primary: Nurse Practitioner

## 2018-07-11 MED ORDER — LOSARTAN POTASSIUM-HCTZ 100-25 MG PO TABS
100-25 MG | ORAL_TABLET | ORAL | 0 refills | Status: DC
Start: 2018-07-11 — End: 2018-07-17

## 2018-07-11 MED ORDER — LOSARTAN POTASSIUM 100 MG PO TABS
100 MG | ORAL_TABLET | ORAL | 0 refills | Status: DC
Start: 2018-07-11 — End: 2018-07-17

## 2018-07-11 MED ORDER — HYDROCHLOROTHIAZIDE 25 MG PO TABS
25 MG | ORAL_TABLET | ORAL | 0 refills | Status: DC
Start: 2018-07-11 — End: 2018-07-17

## 2018-07-17 ENCOUNTER — Ambulatory Visit
Admit: 2018-07-17 | Discharge: 2018-07-17 | Payer: PRIVATE HEALTH INSURANCE | Attending: Family Medicine | Primary: Nurse Practitioner

## 2018-07-17 DIAGNOSIS — I10 Essential (primary) hypertension: Secondary | ICD-10-CM

## 2018-07-17 MED ORDER — PIOGLITAZONE HCL 45 MG PO TABS
45 MG | ORAL_TABLET | ORAL | 2 refills | Status: DC
Start: 2018-07-17 — End: 2019-05-03

## 2018-07-17 MED ORDER — METFORMIN HCL 1000 MG PO TABS
1000 MG | ORAL_TABLET | ORAL | 3 refills | Status: DC
Start: 2018-07-17 — End: 2018-07-18

## 2018-07-17 MED ORDER — LOSARTAN POTASSIUM 100 MG PO TABS
100 MG | ORAL_TABLET | ORAL | 3 refills | Status: DC
Start: 2018-07-17 — End: 2019-10-10

## 2018-07-17 MED ORDER — LOSARTAN POTASSIUM-HCTZ 100-25 MG PO TABS
100-25 MG | ORAL_TABLET | ORAL | 3 refills | Status: DC
Start: 2018-07-17 — End: 2019-04-18

## 2018-07-17 MED ORDER — HYDROCHLOROTHIAZIDE 25 MG PO TABS
25 MG | ORAL_TABLET | ORAL | 3 refills | Status: DC
Start: 2018-07-17 — End: 2019-07-12

## 2018-07-17 MED ORDER — SIMVASTATIN 40 MG PO TABS
40 MG | ORAL_TABLET | ORAL | 3 refills | Status: DC
Start: 2018-07-17 — End: 2018-07-18

## 2018-07-17 NOTE — Patient Instructions (Signed)
Patient Education        Eating Healthy Foods: Care Instructions  Your Care Instructions     Eating healthy foods can help lower your risk for disease. Healthy food gives you energy and keeps your heart strong, your brain active, your muscles working, and your bones strong.  A healthy diet includes a variety of foods from the basic food groups: grains, vegetables, fruits, milk and milk products, and meat and beans. Some people may eat more of their favorite foods from only one food group and, as a result, miss getting the nutrients they need. So, it is important to pay attention not only to what you eat but also to what you are missing from your diet. You can eat a healthy, balanced diet by making a few small changes.  Follow-up care is a key part of your treatment and safety. Be sure to make and go to all appointments, and call your doctor if you are having problems. It's also a good idea to know your test results and keep a list of the medicines you take.  How can you care for yourself at home?  Look at what you eat  ?? Keep a food diary for a week or two and record everything you eat or drink. Track the number of servings you eat from each food group.  ?? For a balanced diet every day, eat a variety of:  ? 6 or more ounce-equivalents of grains, such as cereals, breads, crackers, rice, or pasta, every day. An ounce-equivalent is 1 slice of bread, 1 cup of ready-to-eat cereal, or ?? cup of cooked rice, cooked pasta, or cooked cereal.  ? 2?? cups of vegetables, especially:  ?? Dark-green vegetables such as broccoli and spinach.  ?? Orange vegetables such as carrots and sweet potatoes.  ?? Dry beans (such as pinto and kidney beans) and peas (such as lentils).  ? 2 cups of fresh, frozen, or canned fruit. A small apple or 1 banana or orange equals 1 cup.  ? 3 cups of nonfat or low-fat milk, yogurt, or other milk products.  ? 5?? ounces of meat and beans, such as chicken, fish, lean meat, beans, nuts, and seeds. One egg, 1  tablespoon of peanut butter, ?? ounce nuts or seeds, or ?? cup of cooked beans equals 1 ounce of meat.  ?? Learn how to read food labels for serving sizes and ingredients. Fast-food and convenience-food meals often contain few or no fruits or vegetables. Make sure you eat some fruits and vegetables to make the meal more nutritious.  ?? Look at your food diary. For each food group, add up what you have eaten and then divide the total by the number of days. This will give you an idea of how much you are eating from each food group. See if you can find some ways to change your diet to make it more healthy.  Start small  ?? Do not try to make dramatic changes to your diet all at once. You might feel that you are missing out on your favorite foods and then be more likely to fail.  ?? Start slowly, and gradually change your habits. Try some of the following:  ? Use whole wheat bread instead of white bread.  ? Use nonfat or low-fat milk instead of whole milk.  ? Eat brown rice instead of white rice, and eat whole wheat pasta instead of white-flour pasta.  ? Try low-fat cheeses and low-fat yogurt.  ? Add   more fruits and vegetables to meals and have them for snacks.  ? Add lettuce, tomato, cucumber, and onion to sandwiches.  ? Add fruit to yogurt and cereal.  Enjoy food  ?? You can still eat your favorite foods. You just may need to eat less of them. If your favorite foods are high in fat, salt, and sugar, limit how often you eat them, but do not cut them out entirely.  ?? Eat a wide variety of foods.  Make healthy choices when eating out  ?? The type of restaurant you choose can help you make healthy choices. Even fast-food chains are now offering more low-fat or healthier choices on the menu.  ?? Choose smaller portions, or take half of your meal home.  ?? When eating out, try:  ? A veggie pizza with a whole wheat crust or grilled chicken (instead of sausage or pepperoni).  ? Pasta with roasted vegetables, grilled chicken, or  marinara sauce instead of cream sauce.  ? A vegetable wrap or grilled chicken wrap.  ? Broiled or poached food instead of fried or breaded items.  Make healthy choices easy  ?? Buy packaged, prewashed, ready-to-eat fresh vegetables and fruits, such as baby carrots, salad mixes, and chopped or shredded broccoli and cauliflower.  ?? Buy packaged, presliced fruits, such as melon or pineapple.  ?? Choose 100% fruit or vegetable juice instead of soda. Limit juice intake to 4 to 6 oz (?? to ?? cup) a day.  ?? Blend low-fat yogurt, fruit juice, and canned or frozen fruit to make a smoothie for breakfast or a snack.  Where can you learn more?  Go to [email or web address] and sign in to your MyChart account. Enter T756 in the Search Health Information box to learn more about "Eating Healthy Foods: Care Instructions."     If you do not have an account, please click on the "Sign Up Now" link.  Current as of: September 29, 2017??????????????????????????????Content Version: 12.5  ?? 2006-2020 [employer].   Care instructions adapted under license by [facility]. If you have questions about a medical condition or this instruction, always ask your healthcare professional. [employer], Inc[employer] disclaims any warranty or liability for your use of this information.         Patient Education        Learning About Physical Activity  What is physical activity?     Physical activity is any kind of activity that gets your body moving.  The types of physical activity that can help you get fit and stay healthy include:  ?? Aerobic or "cardio" activities that make your heart beat faster and make you breathe harder, such as brisk walking, riding a bike, or running. Aerobic activities strengthen your heart and lungs and build up your endurance.  ?? Strength training activities that make your muscles work against, or "resist," something, such as lifting weights or doing push-ups. These activities help tone and strengthen your  muscles.  ?? Stretches that allow you to move your joints and muscles through their full range of motion. Stretching helps you be more flexible and avoid injury.  What are the benefits of physical activity?  Being active is one of the best things you can do for your health. It helps you to:  ?? Feel stronger and have more energy to do all the things you like to do.  ?? Focus better at school or work.  ?? Feel, think, and sleep better.  ?? Reach and stay at a   healthy weight.  ?? Lose fat and build lean muscle.  ?? Lower your risk for serious health problems.  ?? Keep your bones, muscles, and joints strong.  How can you make physical activity part of your life?  Get at least 30 minutes of exercise on most days of the week. Walking is a good choice. You also may want to do other activities, such as running, swimming, cycling, or playing tennis or team sports.  Pick activities that you like--ones that make your heart beat faster, your muscles stronger, and your muscles and joints more flexible. If you find more than one thing you like doing, do them all. You don't have to do the same thing every day.  Get your heart pumping every day. Any activity that makes your heart beat faster and keeps it at that rate for a while counts.  Here are some great ways to get your heart beating faster:  ?? Go for a brisk walk, run, or bike ride.  ?? Go for a hike or swim.  ?? Go in-line skating.  ?? Play a game of touch football, basketball, or soccer.  ?? Ride a bike.  ?? Play tennis or racquetball.  ?? Climb stairs.  Even some household chores can be aerobic--just do them at a faster pace. Vacuuming, raking or mowing the lawn, sweeping the garage, and washing and waxing the car all can help get your heart rate up.  Strengthen your muscles during the week. You don't have to lift heavy weights or grow big, bulky muscles to get stronger. Doing a few simple activities that make your muscles work against, or "resist," something can help you get  stronger.  For example, you can:  ?? Do push-ups or sit-ups, which use your own body weight as resistance.  ?? Lift weights or dumbbells or use stretch bands at home or in a gym or community center.  Stretch your muscles often. Stretching will help you as you become more active. It can help you stay flexible, loosen tight muscles, and avoid injury. It can also help improve your balance and posture and can be a great way to relax.  Be sure to stretch the muscles you'll be using when you work out. It's best to warm your muscles slightly before you stretch them. Walk or do some other light aerobic activity for a few minutes, and then start stretching.  When you stretch your muscles:  ?? Do it slowly. Stretching is not about going fast or making sudden movements.  ?? Don't push or bounce during a stretch.  ?? Hold each stretch for at least 15 to 30 seconds, if you can. You should feel a stretch in the muscle, but not pain.  ?? Breathe out as you do the stretch. Then breathe in as you hold the stretch. Don't hold your breath.  If you're worried about how more activity might affect your health, have a checkup before you start. Follow any special advice your doctor gives you for getting a smart start.  Where can you learn more?  Go to [email or web address] and sign in to your MyChart account. Enter W332 in the Search Health Information box to learn more about "Learning About Physical Activity."     If you do not have an account, please click on the "Sign Up Now" link.  Current as of: February 23, 2018??????????????????????????????Content Version: 12.5  ?? 2006-2020 Healthwise, Inc[employer].   Care instructions adapted under license by [facility]. If you have questions about a   medical condition or this instruction, always ask your healthcare professional. [employer], Inc[employer] disclaims any warranty or liability for your use of this information.

## 2018-07-17 NOTE — Progress Notes (Signed)
Subjective:      Patient ID: Gabriel Howell is a 58 y.o. male.    Diabetes   He presents for his follow-up diabetic visit. He has type 2 diabetes mellitus. No MedicAlert identification noted. There are no hypoglycemic associated symptoms. Pertinent negatives for hypoglycemia include no headaches or sweats. Pertinent negatives for diabetes include no blurred vision. There are no hypoglycemic complications. There are no diabetic complications. Risk factors for coronary artery disease include diabetes mellitus, dyslipidemia and obesity. Current diabetic treatment includes oral agent (dual therapy). He is compliant with treatment all of the time. When asked about meal planning, he reported none. He never participates in exercise. An ACE inhibitor/angiotensin II receptor blocker is being taken. He does not see a podiatrist.Eye exam is not current.   Hypertension   This is a chronic problem. The current episode started today. The problem is unchanged. The problem is controlled. Pertinent negatives include no anxiety, blurred vision, headaches, malaise/fatigue, neck pain, orthopnea, palpitations, peripheral edema, PND, shortness of breath or sweats. There are no associated agents to hypertension. Risk factors for coronary artery disease include diabetes mellitus, dyslipidemia and obesity. Past treatments include angiotensin blockers and diuretics. The current treatment provides moderate improvement. Compliance problems include diet.        Review of Systems   Constitutional: Negative for malaise/fatigue.   Eyes: Negative for blurred vision.   Respiratory: Negative for shortness of breath.    Cardiovascular: Negative for palpitations, orthopnea and PND.   Musculoskeletal: Negative for neck pain.   Neurological: Negative for headaches.       Objective:   Physical Exam  Vitals signs and nursing note reviewed.   Constitutional:       Appearance: Normal appearance.   HENT:      Head: Normocephalic and atraumatic.       Right Ear: Tympanic membrane normal.      Left Ear: Tympanic membrane normal.   Eyes:      Extraocular Movements: Extraocular movements intact.      Pupils: Pupils are equal, round, and reactive to light.   Cardiovascular:      Rate and Rhythm: Normal rate and regular rhythm.      Heart sounds: No murmur.   Abdominal:      Palpations: Abdomen is soft.      Tenderness: There is no abdominal tenderness.   Musculoskeletal:      Right lower leg: No edema.      Left lower leg: No edema.   Skin:     General: Skin is warm and dry.   Neurological:      General: No focal deficit present.      Mental Status: He is alert.   Psychiatric:         Mood and Affect: Mood normal.       Visual inspection:  Deformity/amputation: absent  Skin lesions/pre-ulcerative calluses: absent  Edema: right- negative, left- negative    Sensory exam:  Monofilament sensation: normal  (minimum of 5 random plantar locations tested, avoiding callused areas - > 1 area with absence of sensation is + for neuropathy)    Plus at least one of the following:  Pulses: normal,   Pinprick: N/A  Proprioception: N/A  Vibration (128 Hz): N/A    Assessment:      Diabetes   Hypertension   Hyperlipidemia         Plan:      Will check labs per orders.   Will continue current care  Will be pursuing his eye exam      Ferdinando received counseling on the following healthy behaviors: nutrition and exercise  Reviewed prior labs and health maintenance  Continue current medications, diet and exercise.  Discussed use, benefit, and side effects of prescribed medications. Barriers to medication compliance addressed.   Patient given educational materials - see patient instructions  Was a self-tracking handout given in paper form or via MyChart? Yes    Requested Prescriptions      No prescriptions requested or ordered in this encounter       All patient questions answered.  Patient voiced understanding.    Quality Measures    Body mass index is 30.02 kg/m??. Elevated. Weight control  planned discussed Healthy diet and regular exercise.    BP: 128/86 Blood pressure is normal. Treatment plan consists of No treatment change needed.    Lab Results   Component Value Date    LDLCHOLESTEROL 74 07/13/2013    (goal LDL reduction with dx if diabetes is 50% LDL reduction)      PHQ Scores 07/17/2018 05/24/2017 10/05/2016   PHQ2 Score 0 0 0   PHQ9 Score 0 0 0     Interpretation of Total Score Depression Severity: 1-4 = Minimal depression, 5-9 = Mild depression, 10-14 = Moderate depression, 15-19 = Moderately severe depression, 20-27 = Severe depression            Reesa Chew, MD

## 2018-07-17 NOTE — Telephone Encounter (Signed)
Gabriel Howell is requesting a refill on the following medication(s):  Requested Prescriptions     Pending Prescriptions Disp Refills   ??? metFORMIN (GLUCOPHAGE) 1000 MG tablet [Pharmacy Med Name: metFORMIN HCl Oral Tablet 1000 MG] 180 tablet 0     Sig: TAKE 1 TABLET BY MOUTH TWO TIMES A DAY WITH MEALS       Last Visit Date (If Applicable):  06/23/2017    Next Visit Date:    07/17/2018      Appt today!

## 2018-07-18 MED ORDER — SIMVASTATIN 40 MG PO TABS
40 MG | ORAL_TABLET | ORAL | 0 refills | Status: DC
Start: 2018-07-18 — End: 2018-10-12

## 2018-07-18 MED ORDER — METFORMIN HCL 1000 MG PO TABS
1000 MG | ORAL_TABLET | ORAL | 0 refills | Status: DC
Start: 2018-07-18 — End: 2019-07-12

## 2018-07-28 ENCOUNTER — Inpatient Hospital Stay: Primary: Nurse Practitioner

## 2018-07-28 ENCOUNTER — Inpatient Hospital Stay: Payer: PRIVATE HEALTH INSURANCE | Primary: Nurse Practitioner

## 2018-07-28 DIAGNOSIS — I10 Essential (primary) hypertension: Secondary | ICD-10-CM

## 2018-07-28 LAB — CBC WITH AUTO DIFFERENTIAL
Absolute Eos #: 0.25 10*3/uL (ref 0.00–0.44)
Absolute Immature Granulocyte: 0.03 10*3/uL (ref 0.00–0.30)
Absolute Lymph #: 1.87 10*3/uL (ref 1.10–3.70)
Absolute Mono #: 0.46 10*3/uL (ref 0.10–1.20)
Basophils Absolute: 0.05 10*3/uL (ref 0.00–0.20)
Basophils: 1 % (ref 0–2)
Eosinophils %: 3 % (ref 1–4)
Hematocrit: 42.3 % (ref 40.7–50.3)
Hemoglobin: 14.1 g/dL (ref 13.0–17.0)
Immature Granulocytes: 0 %
Lymphocytes: 25 % (ref 24–43)
MCH: 30.3 pg (ref 25.2–33.5)
MCHC: 33.3 g/dL (ref 25.2–33.5)
MCV: 91 fL (ref 82.6–102.9)
MPV: 9.8 fL (ref 8.1–13.5)
Monocytes: 6 % (ref 3–12)
NRBC Automated: 0 per 100 WBC
Platelets: 339 10*3/uL (ref 138–453)
RBC: 4.65 m/uL (ref 4.21–5.77)
RDW: 13.2 % (ref 11.8–14.4)
Seg Neutrophils: 65 % (ref 36–65)
Segs Absolute: 4.82 10*3/uL (ref 1.50–8.10)
WBC: 7.5 10*3/uL (ref 3.5–11.3)

## 2018-07-28 LAB — COMPREHENSIVE METABOLIC PANEL
ALT: 38 U/L (ref 5–41)
AST: 22 U/L (ref <40)
Albumin/Globulin Ratio: 1.6 (ref 1.0–2.5)
Albumin: 4.4 g/dL (ref 3.5–5.2)
Alkaline Phosphatase: 54 U/L (ref 40–129)
Anion Gap: 11 mmol/L (ref 9–17)
BUN: 16 mg/dL (ref 6–20)
Bun/Cre Ratio: 25 — ABNORMAL HIGH (ref 9–20)
CO2: 28 mmol/L (ref 20–31)
Calcium: 9.8 mg/dL (ref 8.6–10.4)
Chloride: 97 mmol/L — ABNORMAL LOW (ref 98–107)
Creatinine: 0.65 mg/dL — ABNORMAL LOW (ref 0.70–1.20)
GFR African American: >60 mL/min (ref >60)
GFR Non-African American: >60 mL/min (ref >60)
Glucose: 259 mg/dL — ABNORMAL HIGH (ref 70–99)
Potassium: 4.4 mmol/L (ref 3.7–5.3)
Sodium: 136 mmol/L (ref 135–144)
Total Bilirubin: 0.33 mg/dL (ref 0.3–1.2)
Total Protein: 7.1 g/dL (ref 6.4–8.3)

## 2018-07-28 LAB — LIPID PANEL
Chol/HDL Ratio: 2.3 (ref <5)
Cholesterol: 95 mg/dL (ref <200)
HDL: 42 mg/dL (ref >40)
LDL Cholesterol: 43 mg/dL (ref 0–130)
Triglycerides: 49 mg/dL (ref <150)

## 2018-07-28 LAB — HEMOGLOBIN A1C
Estimated Avg Glucose: 200 mg/dL
Hemoglobin A1C: 8.6 % — ABNORMAL HIGH (ref 4.8–5.9)

## 2018-07-29 LAB — PSA SCREENING: PSA: 0.5 ug/L (ref <4.1)

## 2018-07-29 LAB — MICROALBUMIN, UR
Creatinine, Ur: 112.6 mg/dL (ref 39.0–259.0)
Microalb, Ur: <12 mg/L (ref <21)

## 2018-10-12 MED ORDER — SIMVASTATIN 40 MG PO TABS
40 MG | ORAL_TABLET | ORAL | 3 refills | Status: AC
Start: 2018-10-12 — End: ?

## 2018-10-12 NOTE — Telephone Encounter (Signed)
Gabriel Howell is requesting a refill on the following medication(s):  Requested Prescriptions     Pending Prescriptions Disp Refills   ??? simvastatin (ZOCOR) 40 MG tablet [Pharmacy Med Name: Simvastatin Oral Tablet 40 MG] 90 tablet 0     Sig: TAKE 1 TABLET BY MOUTH IN THE EVENING       Last Visit Date (If Applicable):  07/17/2018    Next Visit Date:    01/22/2019

## 2018-11-09 ENCOUNTER — Ambulatory Visit
Admit: 2018-11-09 | Discharge: 2018-11-09 | Payer: PRIVATE HEALTH INSURANCE | Attending: Family Medicine | Primary: Nurse Practitioner

## 2018-11-09 DIAGNOSIS — J069 Acute upper respiratory infection, unspecified: Secondary | ICD-10-CM

## 2018-11-09 MED ORDER — AZITHROMYCIN 250 MG PO TABS
250 MG | ORAL_TABLET | ORAL | 0 refills | Status: AC
Start: 2018-11-09 — End: 2018-11-14

## 2018-11-09 NOTE — Addendum Note (Signed)
Addended by: Allene Pyo on: 11/09/2018 04:24 PM     Modules accepted: Orders

## 2018-11-09 NOTE — Progress Notes (Signed)
11/09/2018     Gabriel Howell (DOB:  Sep 26, 1960) is a 58 y.o. male, here for evaluation of the following medical concerns:    HPI   Acute flu clinic visit for illness starting a week ago Monday.  (10days). Started with cold chills and body aches.  Persistent most of the week.  Started having sinus congestion and cough.  No fever to report.  No sore throat.  No sob or wheezing.  Body aches getting better today.  Still has a nuisance cough, mild headache yesterday, better today.   Feeling a little tight through the chest with coughing.  Non smoker, no asthma or rad history.  Working at [employer].  No ill exposures he is aware of.  No medication changes.      Past Medical History:   Diagnosis Date   ??? Hyperlipidemia    ??? Hypertension    ??? Type 2 diabetes mellitus without complication [facility]      Past Surgical History:   Procedure Laterality Date   ??? COLONOSCOPY  10/2015    polyps- repeat in a year- Dr Chauncey [facility]   ??? MALIGNANT SKIN LESION EXCISION Right Comfort         Review of Systems   Constitutional: Positive for fatigue (mild). Negative for fever.   HENT: Positive for congestion, rhinorrhea and sore throat.    Eyes: Negative.    Respiratory: Positive for cough and chest tightness. Negative for shortness of breath.    Cardiovascular: Negative.    Gastrointestinal: Negative.    Endocrine: Negative.    Genitourinary: Negative.    Musculoskeletal: Negative.    Skin: Negative.    Allergic/Immunologic: Negative.    Neurological: Positive for headaches.   Hematological: Negative.    Psychiatric/Behavioral: Negative.        Prior to Visit Medications    Medication Sig Taking? Authorizing Provider   simvastatin (ZOCOR) 40 MG tablet TAKE 1 TABLET BY MOUTH IN THE EVENING  Yes Anne Hahn, MD   metFORMIN (GLUCOPHAGE) 1000 MG tablet TAKE 1 TABLET BY MOUTH TWO TIMES A DAY WITH MEALS  Yes Anne Hahn, MD   losartan-hydrochlorothiazide (HYZAAR) 100-25 MG per  tablet TAKE 1 TABLET BY MOUTH ONE TIME A DAY Yes Anne Hahn, MD   pioglitazone (ACTOS) 45 MG tablet TAKE 1 TABLET BY MOUTH ONE TIME A DAY Yes Anne Hahn, MD   hydroCHLOROthiazide (HYDRODIURIL) 25 MG tablet TAKE 1 TABLET BY MOUTH ONE TIME A DAY Yes Anne Hahn, MD   losartan (COZAAR) 100 MG tablet TAKE 1 TABLET BY MOUTH ONE TIME A DAY Yes Anne Hahn, MD        Social History     Tobacco Use   ??? Smoking status: Never Smoker   ??? Smokeless tobacco: Never Used   Substance Use Topics   ??? Alcohol use: Yes     Comment: one beer per week        Vitals:    11/09/18 1600   BP: 122/82   Pulse: 88   Resp: 16   Temp: 98.9 ??F (37.2 ??C)   SpO2: 96%   Weight: 181 lb (82.1 kg)   Height: 5\' 6"  (1.676 m)     Estimated body mass index is 29.21 kg/m?? as calculated from the following:    Height as of this encounter: 5\' 6"  (1.676 m).    Weight as of this encounter: 181  lb (82.1 kg).    Physical Exam  Constitutional:       Appearance: He is well-developed.   HENT:      Head: Normocephalic and atraumatic.   Eyes:      Pupils: Pupils are equal, round, and reactive to light.   Neck:      Musculoskeletal: Normal range of motion and neck supple.   Cardiovascular:      Rate and Rhythm: Normal rate and regular rhythm.      Heart sounds: Normal heart sounds. No murmur.   Pulmonary:      Effort: Pulmonary effort is normal. No respiratory distress.      Breath sounds: Normal breath sounds. No wheezing or rales.   Abdominal:      General: There is no distension.      Palpations: Abdomen is soft. There is no mass.      Tenderness: There is no abdominal tenderness.   Musculoskeletal: Normal range of motion.         General: No tenderness.   Skin:     General: Skin is warm.      Findings: No erythema or rash.   Neurological:      Mental Status: He is alert.   Psychiatric:         Behavior: Behavior normal.         Thought Content: Thought content normal.         Judgment: Judgment normal.     BP 122/82    Pulse 88    Temp 98.9 ??F (37.2 ??C)    Resp  16    Ht 5\' 6"  (1.676 m)    Wt 181 lb (82.1 kg)    SpO2 96%    BMI 29.21 kg/m??       ASSESSMENT/PLAN:  Encounter Diagnosis   Name Primary?   ??? Acute upper respiratory infection Yes     He is 10 days out from onset and most of the symptoms have now resolved.    Discussed typical course, supportive care, and complications  Consider flonase for residual , dry, inflammation in the nose.   Cont. Cough suppression.       An electronic signature was used to authenticate this note.    -- , MD on 11/09/2018 at 4:03 PM

## 2018-12-11 ENCOUNTER — Ambulatory Visit: Admit: 2018-12-11 | Discharge: 2018-12-11 | Payer: PRIVATE HEALTH INSURANCE | Primary: Nurse Practitioner

## 2018-12-11 DIAGNOSIS — Z23 Encounter for immunization: Secondary | ICD-10-CM

## 2018-12-11 NOTE — Progress Notes (Signed)
Have you had an allergic reaction to the flu (influenza) shot? No  Are you allergic to eggs or any component of the flu vaccine? No  Do you have a history of Guillain-Barre Syndrome (GBS), which is paralysis after receiving the flu vaccine? No  Are you feeling well today? Yes  Flu vaccine given as ordered.  Patient tolerated it well.  No questions re: VIS information.

## 2019-01-22 ENCOUNTER — Encounter: Attending: Family Medicine | Primary: Nurse Practitioner

## 2019-02-28 ENCOUNTER — Encounter: Admit: 2019-02-28 | Discharge: 2019-02-28 | Payer: PRIVATE HEALTH INSURANCE | Primary: Nurse Practitioner

## 2019-03-28 ENCOUNTER — Ambulatory Visit
Admit: 2019-03-28 | Discharge: 2019-03-28 | Payer: PRIVATE HEALTH INSURANCE | Attending: Family | Primary: Nurse Practitioner

## 2019-03-28 DIAGNOSIS — I10 Essential (primary) hypertension: Secondary | ICD-10-CM

## 2019-03-28 MED ORDER — AMLODIPINE BESYLATE 5 MG PO TABS
5 MG | ORAL_TABLET | Freq: Every day | ORAL | 5 refills | Status: DC
Start: 2019-03-28 — End: 2019-04-18

## 2019-04-18 ENCOUNTER — Ambulatory Visit
Admit: 2019-04-18 | Discharge: 2019-04-18 | Payer: PRIVATE HEALTH INSURANCE | Attending: Family | Primary: Nurse Practitioner

## 2019-04-18 DIAGNOSIS — I10 Essential (primary) hypertension: Secondary | ICD-10-CM

## 2019-04-18 MED ORDER — AMLODIPINE BESYLATE 10 MG PO TABS
10 MG | ORAL_TABLET | Freq: Every day | ORAL | 3 refills | Status: DC
Start: 2019-04-18 — End: 2019-10-10

## 2019-04-26 ENCOUNTER — Ambulatory Visit
Admit: 2019-04-26 | Discharge: 2019-04-26 | Payer: PRIVATE HEALTH INSURANCE | Attending: Family Medicine | Primary: Nurse Practitioner

## 2019-04-26 DIAGNOSIS — M25512 Pain in left shoulder: Secondary | ICD-10-CM

## 2019-04-26 MED ORDER — METOPROLOL TARTRATE 50 MG PO TABS
50 | ORAL_TABLET | Freq: Two times a day (BID) | ORAL | 3 refills | Status: DC
Start: 2019-04-26 — End: 2019-05-23

## 2019-04-28 LAB — CBC WITH AUTO DIFFERENTIAL
Basophils %: 1.229 (ref 0.0–3.0)
Basophils Absolute: 0.0958 (ref 0.0–0.3)
Eosinophils %: 5.117 (ref 0.0–10.0)
Eosinophils Absolute: 0.3987 (ref 0.0–1.1)
Hematocrit: 45.1 % (ref 42.0–52.0)
Hemoglobin: 14.6 (ref 13.8–17.8)
Lymphocyte %: 24.37 (ref 20–51.1)
Lymphocytes Absolute: 1.899 (ref 1.0–5.5)
MCH: 30.1 pg (ref 28.5–32.5)
MCHC: 32.2 g/dL (ref 32.0–37.0)
MCV: 93.2 fl (ref 80.0–94.0)
Monocytes %: 5.246 (ref 1.7–9.3)
Monocytes Absolute: 0.4087 (ref 0.1–1.0)
Neutrophils %: 64.04 (ref 42.2–75.2)
Neutrophils Absolute: 4.989 (ref 2.0–8.1)
Platelets: 340.1 10*3/uL (ref 130–400)
RBC: 4.84 M/uL (ref 4.70–6.10)
RDW: 12.4 % (ref 10.0–15.5)
WBC: 7.8 Thou/mL3 (ref 4.8–10.8)

## 2019-04-28 LAB — BASIC METABOLIC PANEL
Anion Gap: 11.5 mmol/L
BUN: 21 mg/dL — ABNORMAL HIGH (ref 9–20)
CO2: 32 mmol/L — ABNORMAL HIGH (ref 22–31)
Calcium: 9.7 mg/dL (ref 8.4–10.2)
Chloride: 96 mmol/L — ABNORMAL LOW (ref 98–120)
Creatinine: 0.6 mg/dL — ABNORMAL LOW (ref 0.7–1.3)
Gfr Calculated: >60
Glucose: 288 mg/dL — ABNORMAL HIGH (ref 75–110)
Potassium: 4.5 mmol/L (ref 3.6–5.0)
Sodium: 135 mmol/L (ref 135–145)

## 2019-04-28 LAB — HEMOGLOBIN A1C: Hemoglobin A1C: 9.1 % — ABNORMAL HIGH (ref 4.4–6.4)

## 2019-05-03 ENCOUNTER — Ambulatory Visit
Admit: 2019-05-03 | Discharge: 2019-05-03 | Payer: PRIVATE HEALTH INSURANCE | Attending: Family Medicine | Primary: Nurse Practitioner

## 2019-05-03 DIAGNOSIS — E119 Type 2 diabetes mellitus without complications: Secondary | ICD-10-CM

## 2019-05-03 MED ORDER — RYBELSUS 3 MG PO TABS
3 | ORAL_TABLET | Freq: Every day | ORAL | 0 refills | Status: DC
Start: 2019-05-03 — End: 2019-05-18

## 2019-05-03 MED ORDER — DOXYCYCLINE HYCLATE 100 MG PO CAPS
100 MG | ORAL_CAPSULE | Freq: Every day | ORAL | 0 refills | Status: DC
Start: 2019-05-03 — End: 2019-05-24

## 2019-05-15 ENCOUNTER — Encounter

## 2019-05-17 ENCOUNTER — Ambulatory Visit
Admit: 2019-05-17 | Discharge: 2019-05-17 | Payer: PRIVATE HEALTH INSURANCE | Attending: Internal Medicine | Primary: Nurse Practitioner

## 2019-05-17 DIAGNOSIS — R600 Localized edema: Secondary | ICD-10-CM

## 2019-05-17 MED ORDER — GLIMEPIRIDE 4 MG PO TABS
4 MG | ORAL_TABLET | Freq: Every day | ORAL | 2 refills | Status: AC
Start: 2019-05-17 — End: ?

## 2019-05-17 NOTE — Telephone Encounter (Signed)
From: Su Monks  To: Reesa Chew, MD  Sent: 05/15/2019 4:48 PM EDT  Subject: Prescription Question    Dr Carolynn Sayers   I wasn't able to getPrescription of rybelsus for my Diabetes it was to Expensive is there something else pill form I can try? Let me know thanks!

## 2019-05-23 ENCOUNTER — Ambulatory Visit
Admit: 2019-05-23 | Discharge: 2019-05-23 | Payer: PRIVATE HEALTH INSURANCE | Attending: Family | Primary: Nurse Practitioner

## 2019-05-23 DIAGNOSIS — I10 Essential (primary) hypertension: Secondary | ICD-10-CM

## 2019-05-23 MED ORDER — METOPROLOL TARTRATE 100 MG PO TABS
100 MG | ORAL_TABLET | Freq: Two times a day (BID) | ORAL | 3 refills | Status: DC
Start: 2019-05-23 — End: 2019-07-16

## 2019-05-24 ENCOUNTER — Ambulatory Visit
Admit: 2019-05-24 | Discharge: 2019-05-24 | Payer: PRIVATE HEALTH INSURANCE | Attending: Family Medicine | Primary: Nurse Practitioner

## 2019-05-24 DIAGNOSIS — I10 Essential (primary) hypertension: Secondary | ICD-10-CM

## 2019-06-04 ENCOUNTER — Encounter: Attending: Family | Primary: Nurse Practitioner

## 2019-06-25 ENCOUNTER — Ambulatory Visit
Admit: 2019-06-25 | Discharge: 2019-06-25 | Payer: PRIVATE HEALTH INSURANCE | Attending: Family | Primary: Nurse Practitioner

## 2019-06-25 DIAGNOSIS — I10 Essential (primary) hypertension: Secondary | ICD-10-CM

## 2019-07-12 MED ORDER — METFORMIN HCL 1000 MG PO TABS
1000 MG | ORAL_TABLET | ORAL | 0 refills | Status: DC
Start: 2019-07-12 — End: 2019-10-10

## 2019-07-12 MED ORDER — HYDROCHLOROTHIAZIDE 25 MG PO TABS
25 MG | ORAL_TABLET | ORAL | 3 refills | Status: AC
Start: 2019-07-12 — End: ?

## 2019-07-12 NOTE — Telephone Encounter (Signed)
Su Monks is requesting a refill on the following medication(s):  Requested Prescriptions     Pending Prescriptions Disp Refills   ??? metFORMIN (GLUCOPHAGE) 1000 MG tablet 180 tablet 0   ??? hydroCHLOROthiazide (HYDRODIURIL) 25 MG tablet 90 tablet 3     Sig: TAKE 1 TABLET BY MOUTH ONE TIME A DAY       Last Visit Date (If Applicable):  06/25/2019    Next Visit Date:    07/30/2019

## 2019-07-16 MED ORDER — METOPROLOL TARTRATE 100 MG PO TABS
100 MG | ORAL_TABLET | Freq: Two times a day (BID) | ORAL | 3 refills | Status: DC
Start: 2019-07-16 — End: 2019-10-17

## 2019-07-30 ENCOUNTER — Encounter: Attending: Family | Primary: Nurse Practitioner

## 2019-08-02 ENCOUNTER — Ambulatory Visit
Admit: 2019-08-02 | Discharge: 2019-08-02 | Payer: PRIVATE HEALTH INSURANCE | Attending: Family | Primary: Nurse Practitioner

## 2019-08-02 DIAGNOSIS — E119 Type 2 diabetes mellitus without complications: Secondary | ICD-10-CM

## 2019-08-02 LAB — POCT GLYCOSYLATED HEMOGLOBIN (HGB A1C): Hemoglobin A1C: 7.3 %

## 2019-10-09 ENCOUNTER — Encounter

## 2019-10-10 MED ORDER — LOSARTAN POTASSIUM 100 MG PO TABS
100 MG | ORAL_TABLET | ORAL | 3 refills | Status: AC
Start: 2019-10-10 — End: ?

## 2019-10-10 MED ORDER — METFORMIN HCL 1000 MG PO TABS
1000 MG | ORAL_TABLET | ORAL | 2 refills | Status: AC
Start: 2019-10-10 — End: ?

## 2019-10-10 MED ORDER — AMLODIPINE BESYLATE 10 MG PO TABS
10 MG | ORAL_TABLET | Freq: Every day | ORAL | 2 refills | Status: AC
Start: 2019-10-10 — End: ?

## 2019-10-10 NOTE — Telephone Encounter (Signed)
Gabriel Howell is requesting a refill on the following medication(s):  Requested Prescriptions     Pending Prescriptions Disp Refills   ??? losartan (COZAAR) 100 MG tablet 90 tablet 3     Sig: TAKE 1 TABLET BY MOUTH ONE TIME A DAY       Last Visit Date (If Applicable):  08/02/2019    Next Visit Date:    10/31/2019

## 2019-10-10 NOTE — Telephone Encounter (Signed)
Last appointment: 08/02/2019  Next appointment: 10/31/2019

## 2019-10-16 NOTE — Telephone Encounter (Signed)
Gabriel Howell is requesting a refill on the following medication(s):  Requested Prescriptions     Pending Prescriptions Disp Refills   ??? metoprolol (LOPRESSOR) 100 MG tablet [Pharmacy Med Name: Metoprolol Tartrate Oral Tablet 100 MG] 60 tablet 0     Sig: TAKE 1 TABLET BY MOUTH TWO TIMES A DAY       Last Visit Date (If Applicable):  08/02/2019    Next Visit Date:    10/31/2019

## 2019-10-17 MED ORDER — METOPROLOL TARTRATE 100 MG PO TABS
100 MG | ORAL_TABLET | ORAL | 3 refills | Status: AC
Start: 2019-10-17 — End: 2021-09-29

## 2019-10-31 ENCOUNTER — Encounter: Payer: PRIVATE HEALTH INSURANCE | Attending: Family | Primary: Nurse Practitioner

## 2019-11-28 IMAGING — MR MRI BRAIN W/WO CONTRAST
8 of 11 series · 26 of 48 positions shown · IV contrast (prohance)
Comparison: MRI brain study dated 10/09/2019, and 10/28/2019 from [facility] [[facility]OS[facility]

HISTORY: 59-year-old male with metastatic cancer, headaches. History of melanoma.
TECHNIQUE: Pre- and postcontrast enhanced  MRI study of the brain was performed using sagittal, coronal and axial images of varying sequences. 

IV contrast:  15 cc ProHance.

[Series 5: flair_axial fs · axial · 4.0mm · 0.75mm/px · z∈[-5,+143]mm · 2 of 32 slices shown]
[im 1/32]
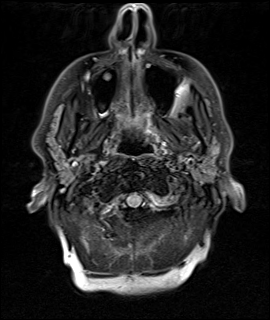
[im 32/32]
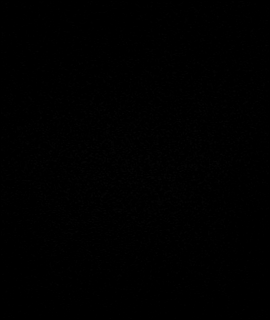

[Series 6: t2_axial · axial · 4.0mm · 0.36mm/px · 1 of 32 slices shown]
[im 1/32]
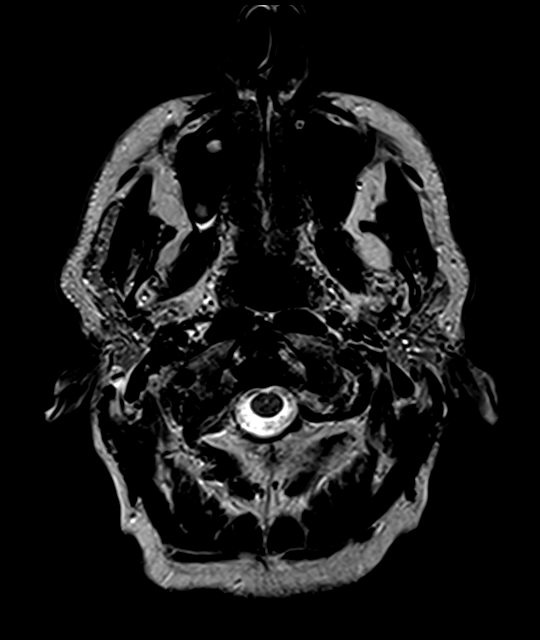

[Series 7: DWI · axial · 4.0mm · 1.31mm/px · 1 of 32 slices shown (1 of 2)]
[im 1/32]
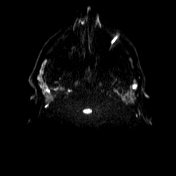

[Series 8: DWI · axial · 4.0mm · 1.31mm/px · 1 of 31 slices shown (2 of 2)]
[im 1/31]
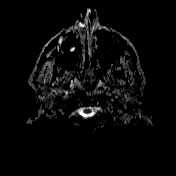

[Series 9: flash_axial · axial · 4.0mm · 0.90mm/px · 1 of 32 slices shown]
[im 1/32]
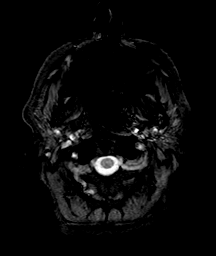

[Series 10: t1_mprage axial · axial · 1.0mm · 0.90mm/px · z∈[-4,+128]mm · 6 of 144 slices shown]
[im 1/144]
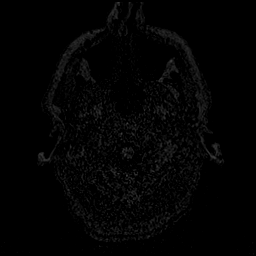
[im 29/144]
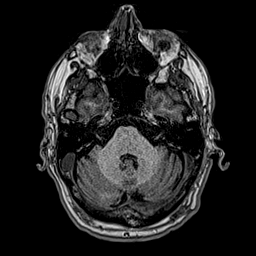
[im 58/144]
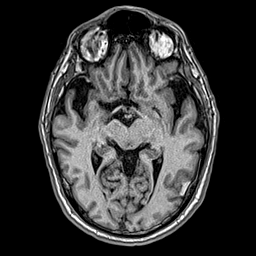
[im 86/144]
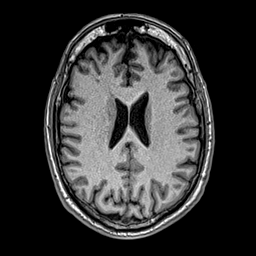
[im 115/144]
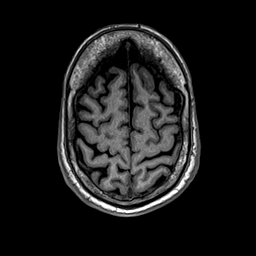
[im 144/144]
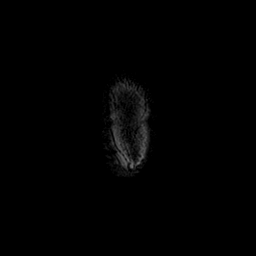

[Series 11: t1_mprage axial_mpr_mprage cor · coronal · 1.0mm · 0.47mm/px · 8 of 190 slices shown]
[im 1/190]
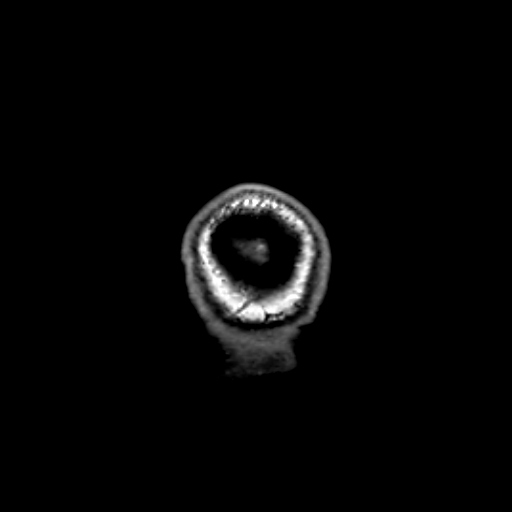
[im 28/190]
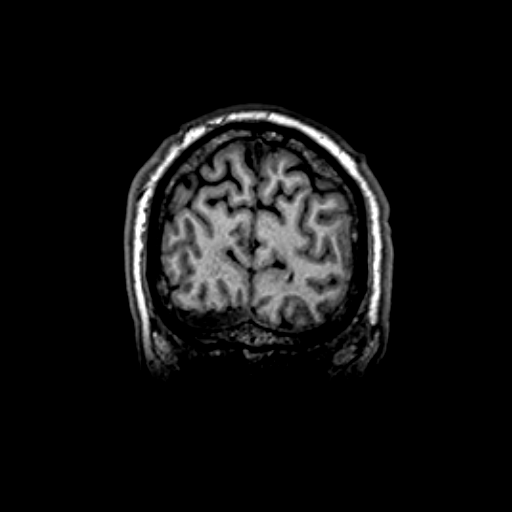
[im 55/190]
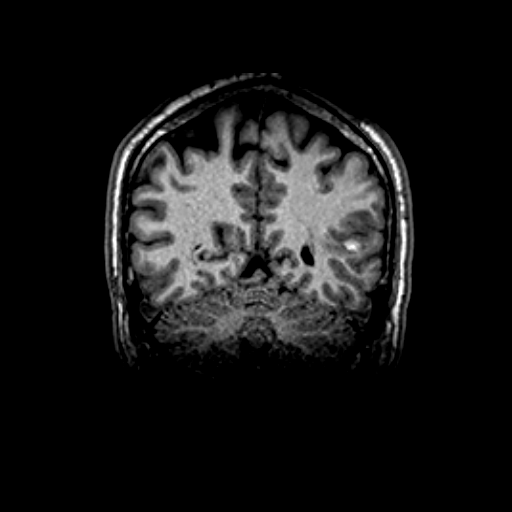
[im 82/190]
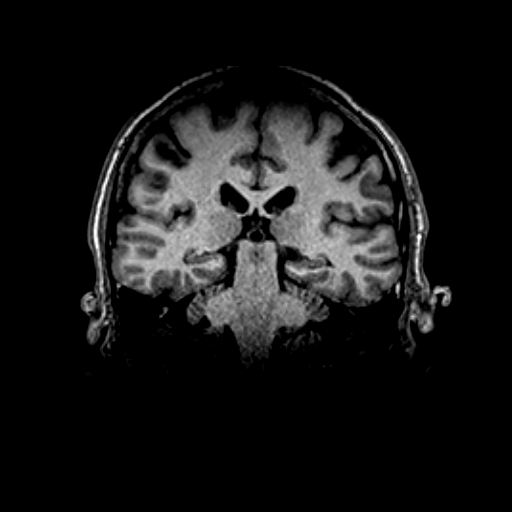
[im 109/190]
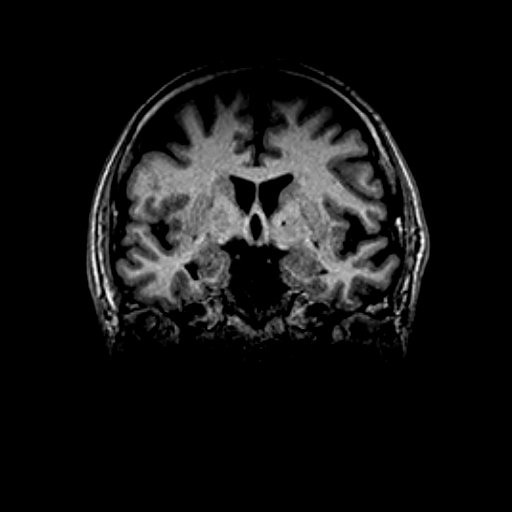
[im 136/190]
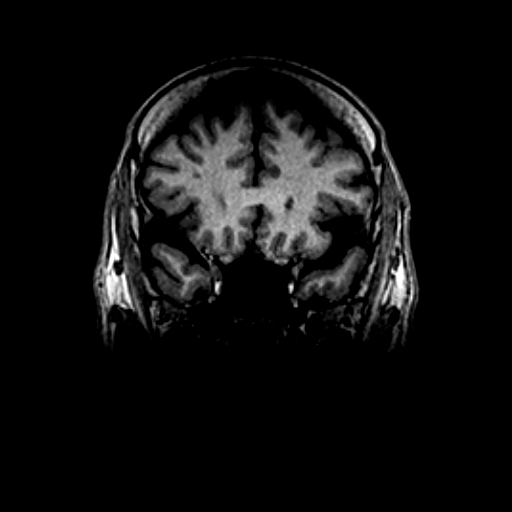
[im 163/190]
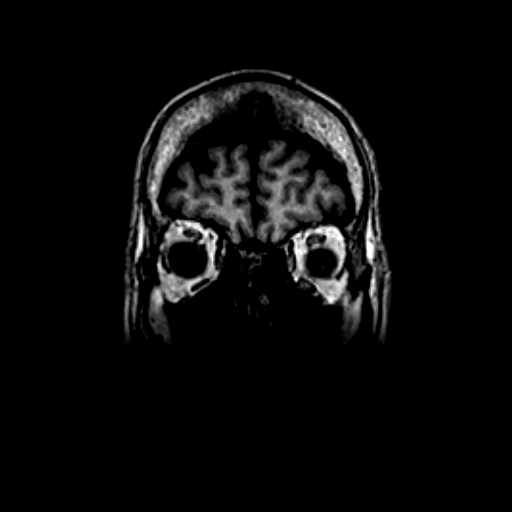
[im 190/190]
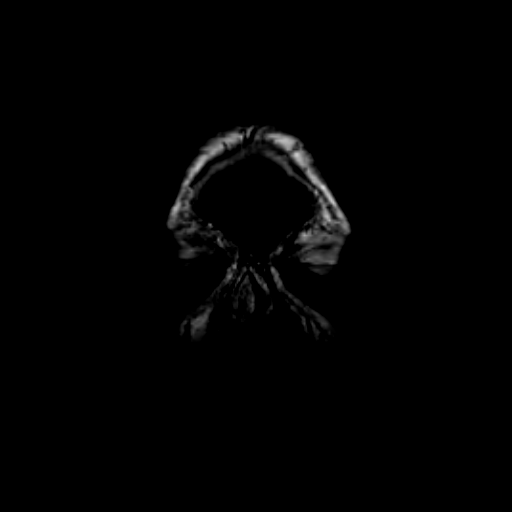

[Series 12: t1_mprage axial_mpr_mprage sag · sagittal · 1.0mm · 0.47mm/px · 6 of 170 slices shown]
[im 1/170]
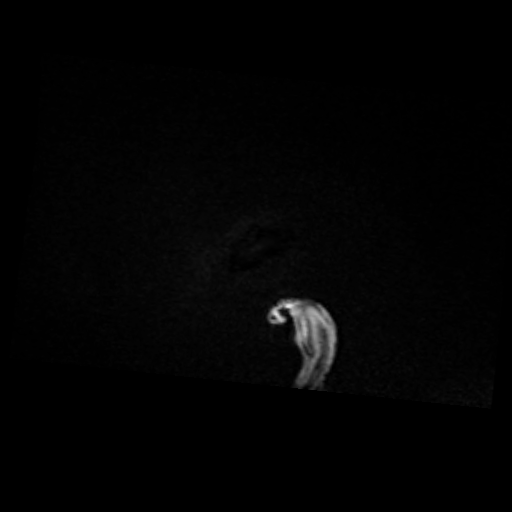
[im 29/170]
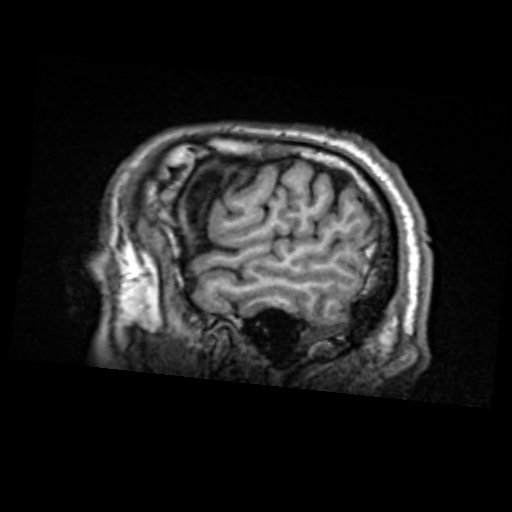
[im 57/170]
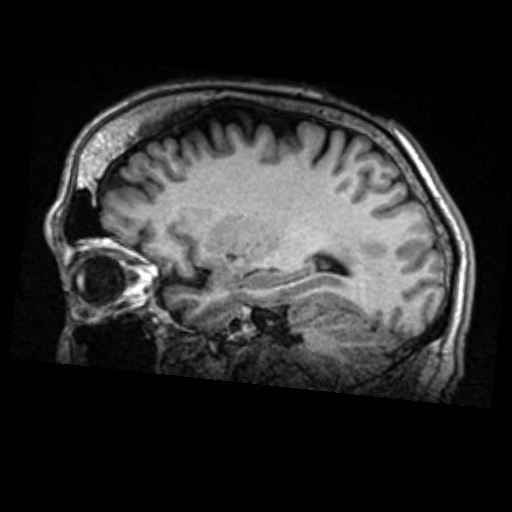
[im 85/170]
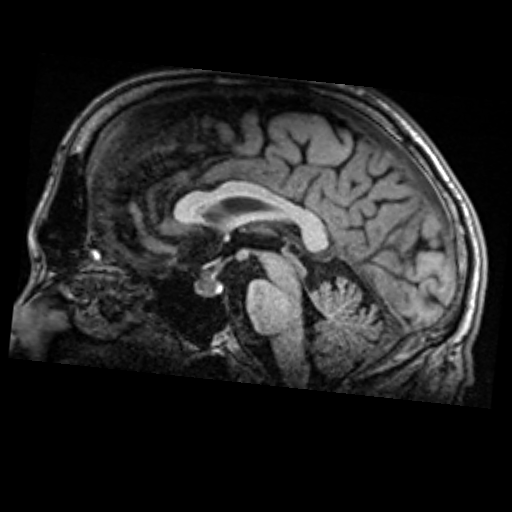
[im 113/170]
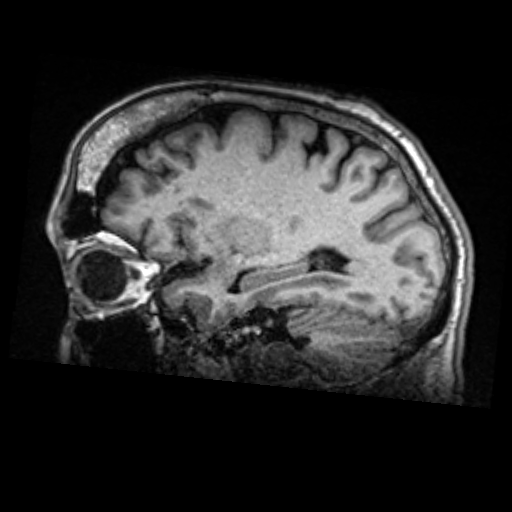
[im 141/170]
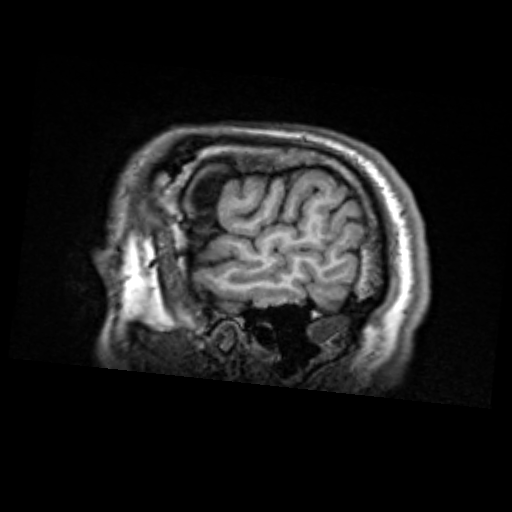

[26 of 48 positions shown; findings below may reference images not displayed]

FINDINGS: There is an abnormal T1 bright and mild enhancing intraparenchymal mass identified in the left parieto-occipital lobe measuring maximally 2.4 x 1.8 x 2.4 cm (AP, transverse and superior-inferior dimensions). There is a 4 mm enhancing nodule anterior to the left parieto-occipital lobe intraparenchymal mass measuring 4 mm best demonstrated on axial image #69 of series 13. There is associated vasogenic edema. There is also a punctate T2 dark signal identified on axial image #19 series 9.

The brain stem, cerebellum are unremarkable. A large pituitary gland is present.

Flow voids are demonstrated in the major intracranial vessels. The globes, sinuses and mastoid air cells are otherwise unremarkable.

There is no MRI evidence of acute infarct.
IMPRESSION: 1. 2.4 x 1.8 x 2.4 cm (AP, transverse and superior-inferior dimensions) left parieto-occipital mass. There is also a 4 mm enhancing nodule anterior to the mass. The 4 mm enhancing nodule is new compared to prior examinations. These findings are compatible with patient's history of melanoma metastasis in the brain.

2. Enlarged pituitary gland. This is also new compared to 10/28/2019. Hypophysitis is a consideration.

3. Punctate T2 dark density identified in the right frontal lobe unchanged.

## 2020-07-02 IMAGING — CR [HOSPITAL] SKULL
1 series · 2 of 2 positions shown · non-contrast
Comparison: None

HISTORY/INDICATION:  History of intracranial procedure.
TECHNIQUE: Skull, two views

[Series 1: PA · 0.17mm/px · 2 of 2 slices shown]
[im 1/2]
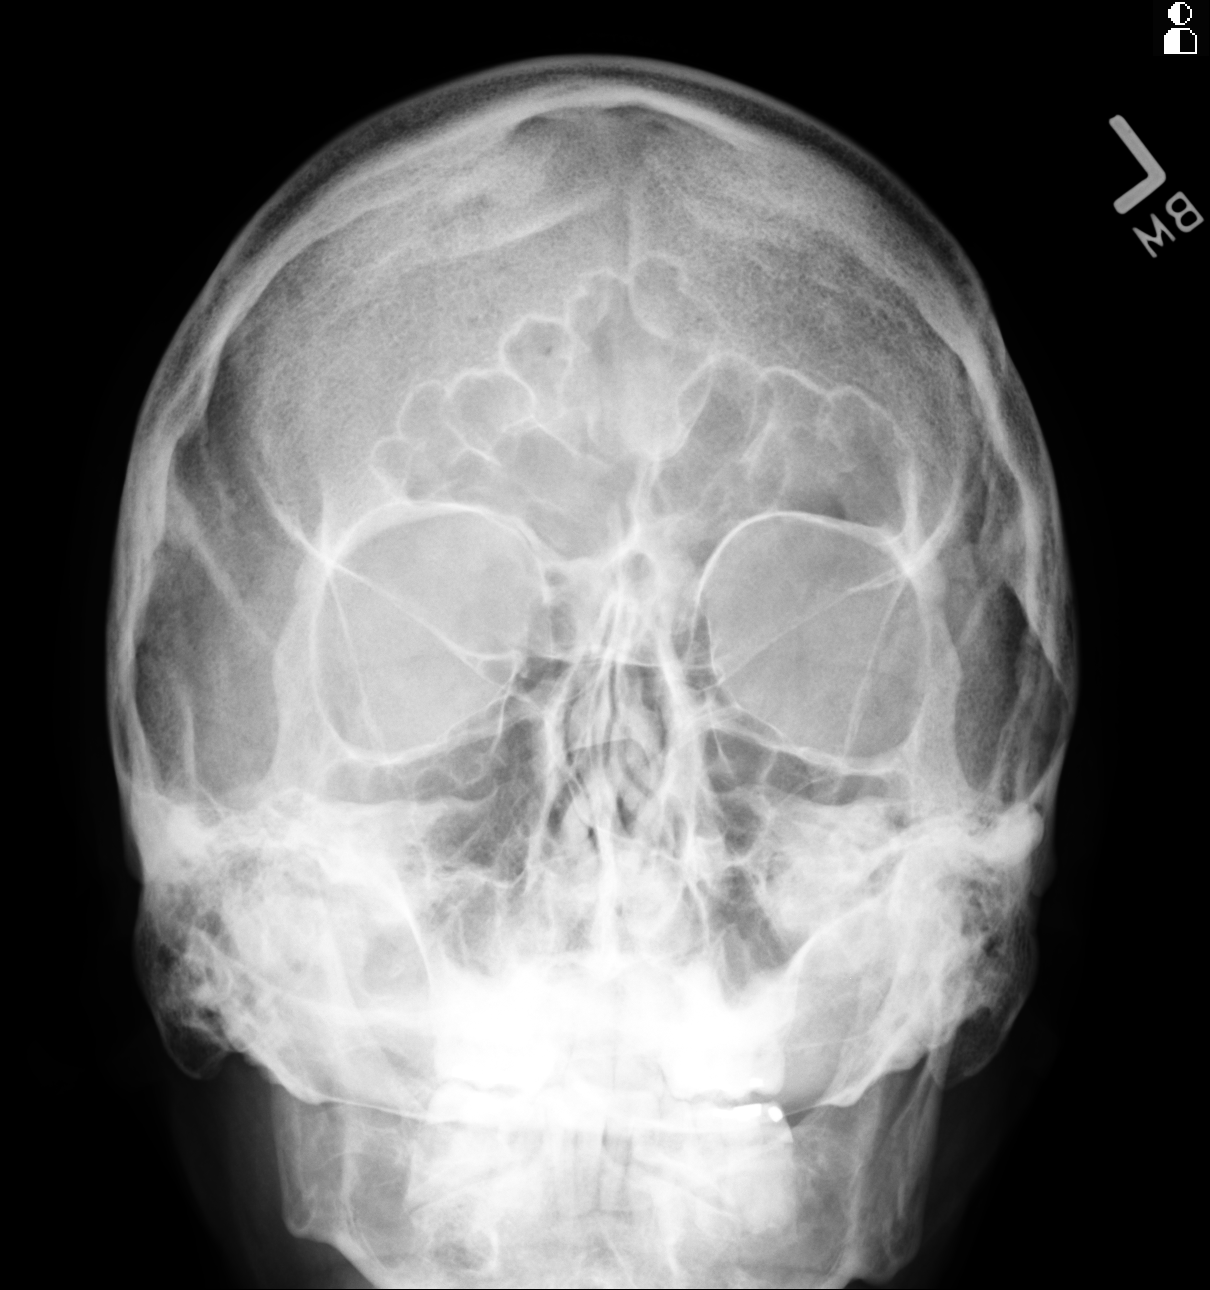
[im 2/2]
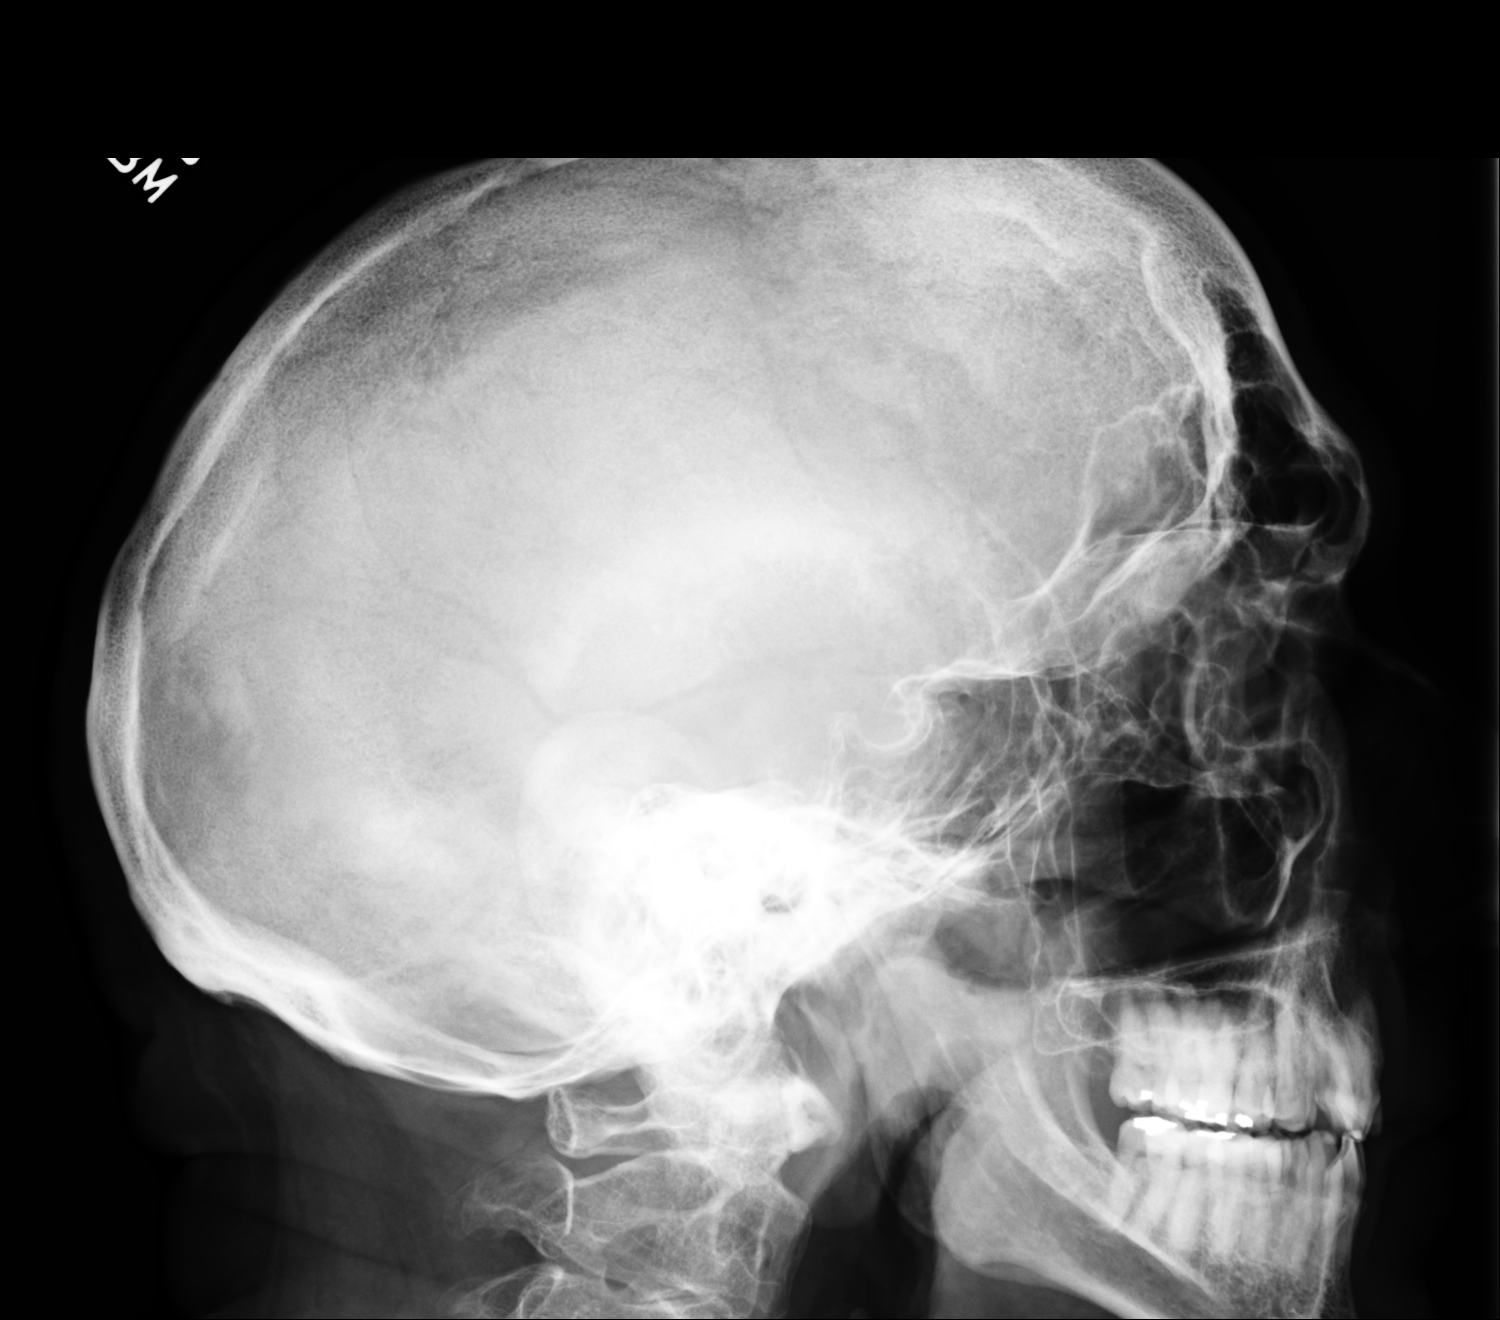

[2 of 2 positions shown; findings below may reference images not displayed]

FINDINGS: Skull radiographs are normal. No previous intracranial surgery has been performed and there are no contraindications to MRI.
IMPRESSION: Normal skull.

## 2020-07-02 IMAGING — MR MRI BRAIN W/WO CONTRAST
8 of 11 series · 26 of 48 positions shown · IV contrast (prohance)
Comparison: MRI brain without and with contrast study dated 11/28/2019.

HISTORY: 59-year-old male with melanoma, mental status changes, increased forgetfulness.
TECHNIQUE: Pre- and postcontrast enhanced  MRI study of the brain was performed using sagittal, coronal and axial images of varying sequences. 

IV contrast:  15 cc ProHance.

[Series 5: flair_axial fs · axial · 4.0mm · 0.72mm/px · z∈[-7,+131]mm · 2 of 30 slices shown]
[im 1/30]
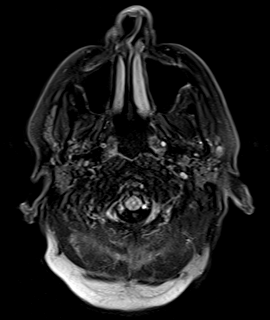
[im 30/30]
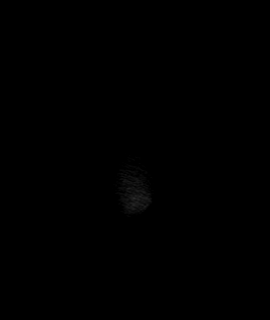

[Series 6: t2_axial · axial · 4.0mm · 0.36mm/px · 1 of 30 slices shown]
[im 1/30]
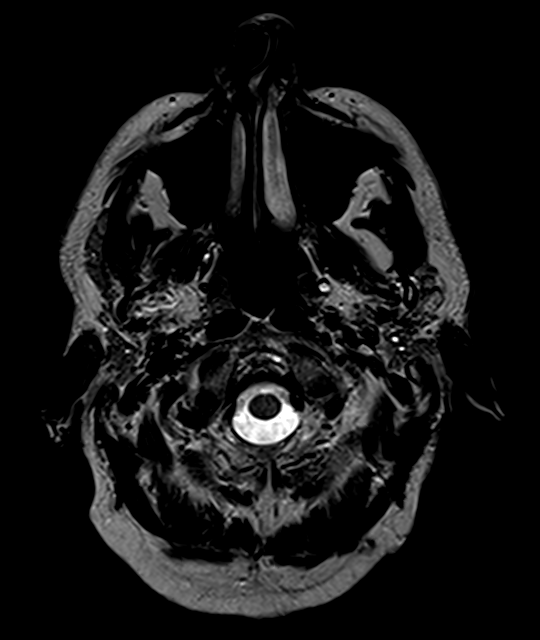

[Series 7: DWI · axial · 4.0mm · 1.31mm/px · 1 of 30 slices shown (1 of 2)]
[im 1/30]
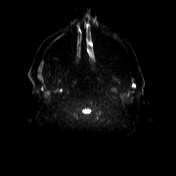

[Series 8: DWI · axial · 4.0mm · 1.31mm/px · 1 of 30 slices shown (2 of 2)]
[im 1/30]
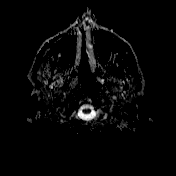

[Series 9: flash_axial · axial · 4.0mm · 0.90mm/px · 1 of 30 slices shown]
[im 1/30]
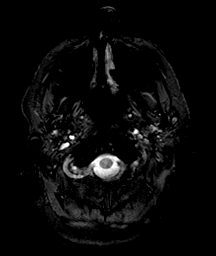

[Series 10: t1_mprage axial · axial · 1.0mm · 0.90mm/px · z∈[-9,+134]mm · 6 of 160 slices shown]
[im 1/160]
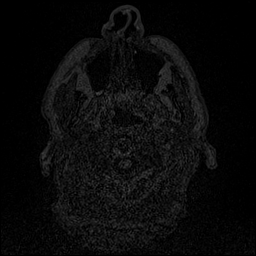
[im 32/160]
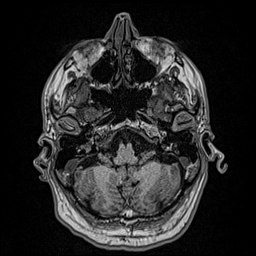
[im 64/160]
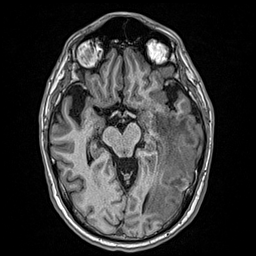
[im 96/160]
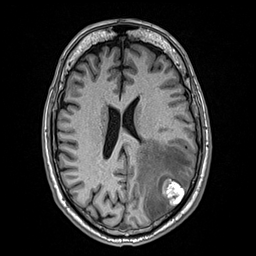
[im 128/160]
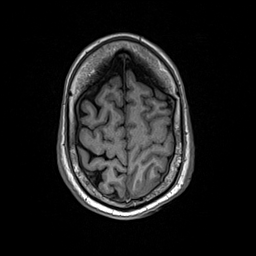
[im 160/160]
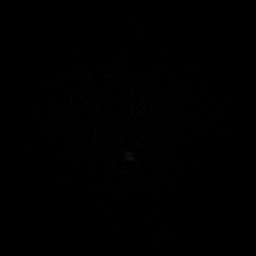

[Series 11: t1_mprage axial_mpr_mprage cor · coronal · 1.0mm · 0.47mm/px · 8 of 189 slices shown]
[im 1/189]
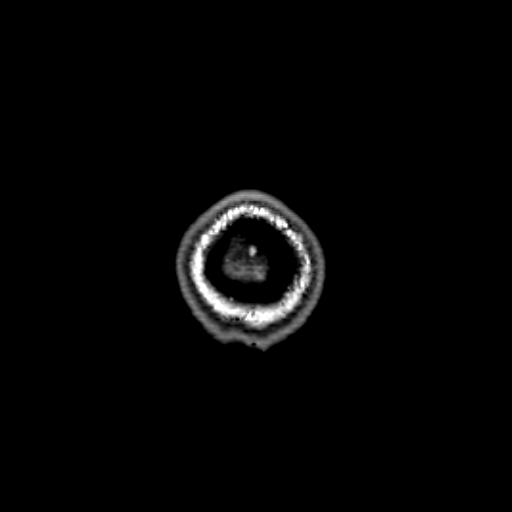
[im 27/189]
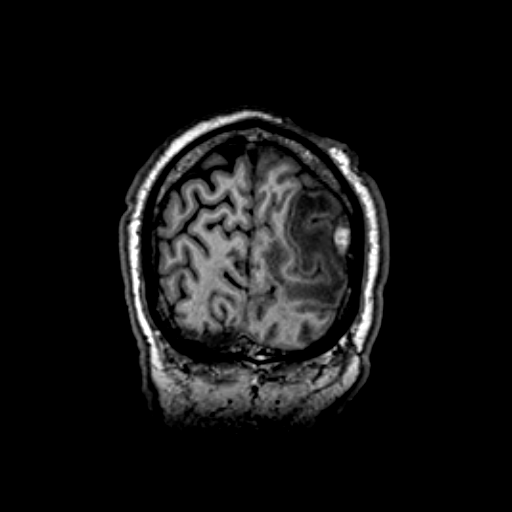
[im 54/189]
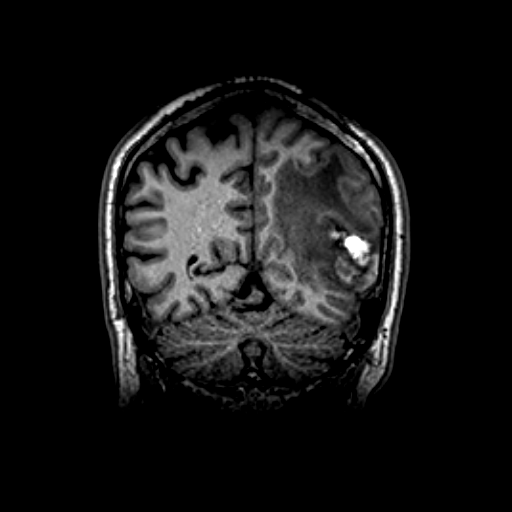
[im 81/189]
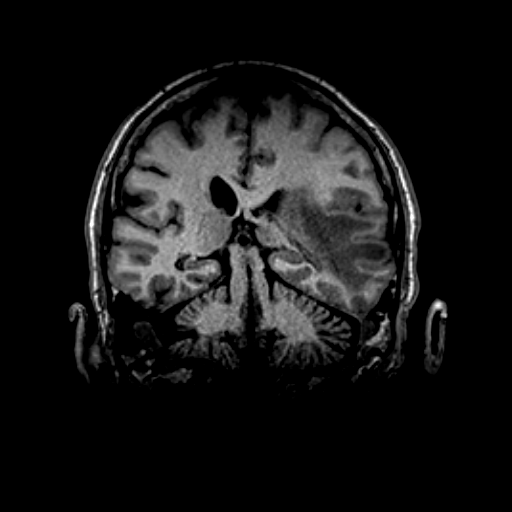
[im 108/189]
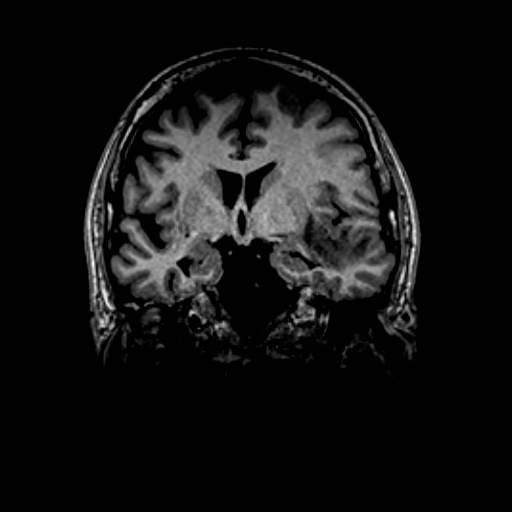
[im 135/189]
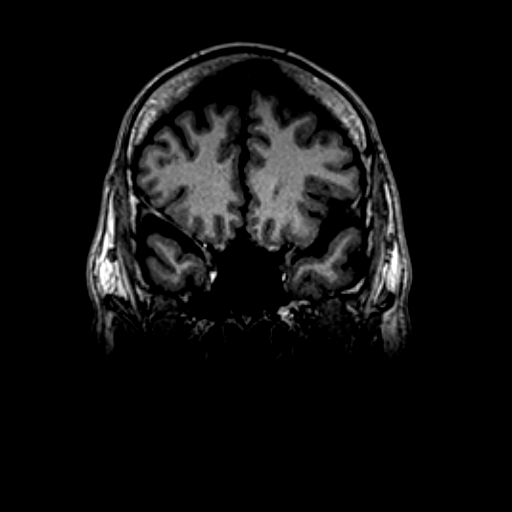
[im 162/189]
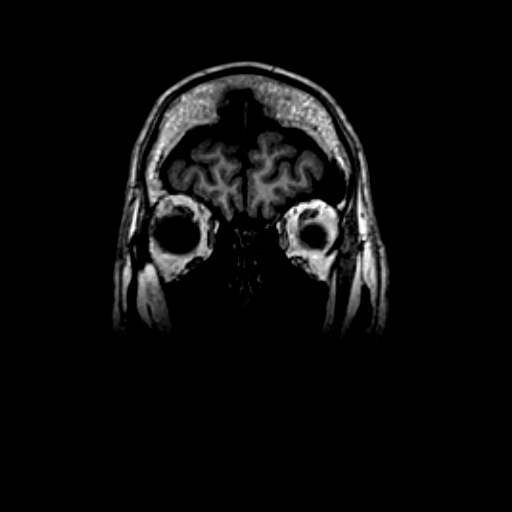
[im 189/189]
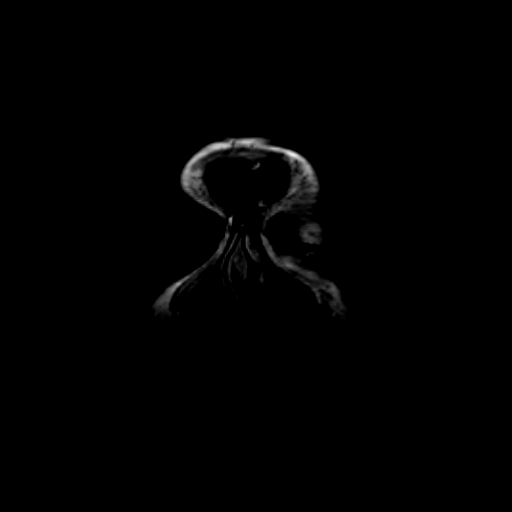

[Series 12: t1_mprage axial_mpr_mprage sag · sagittal · 1.0mm · 0.47mm/px · 6 of 170 slices shown]
[im 1/170]
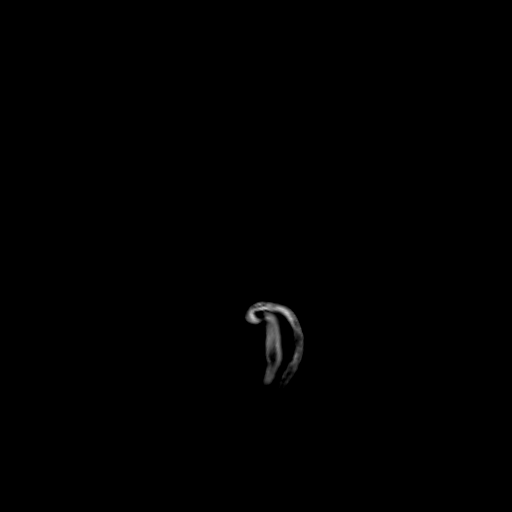
[im 29/170]
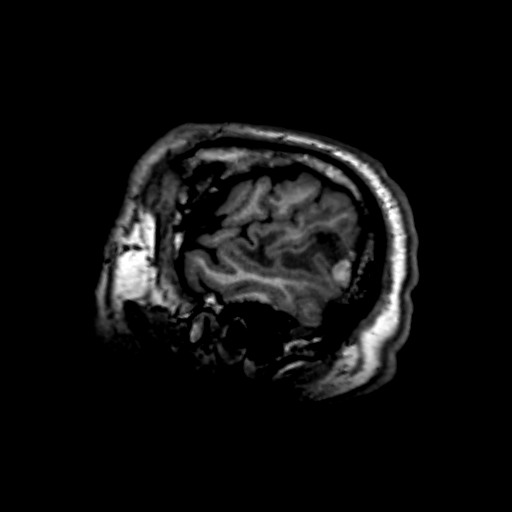
[im 57/170]
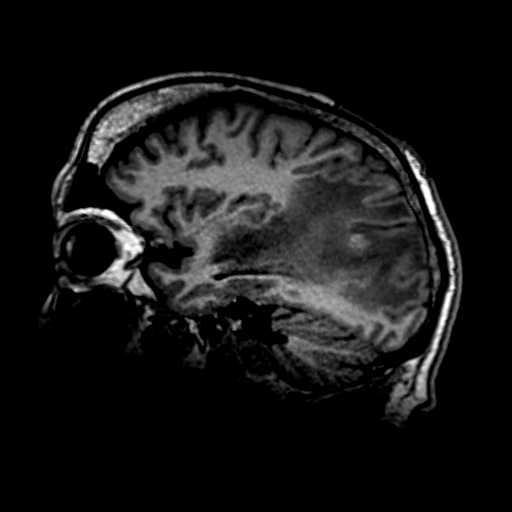
[im 85/170]
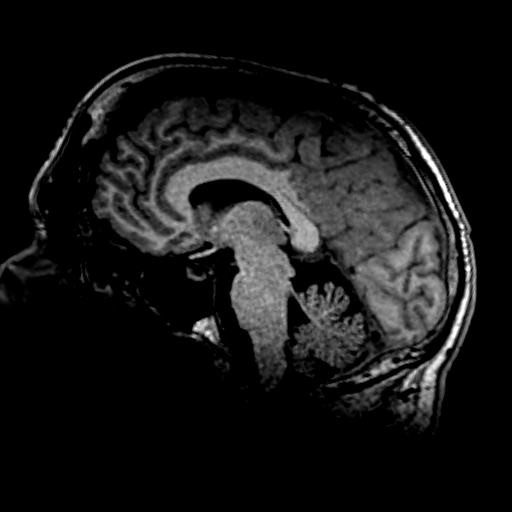
[im 113/170]
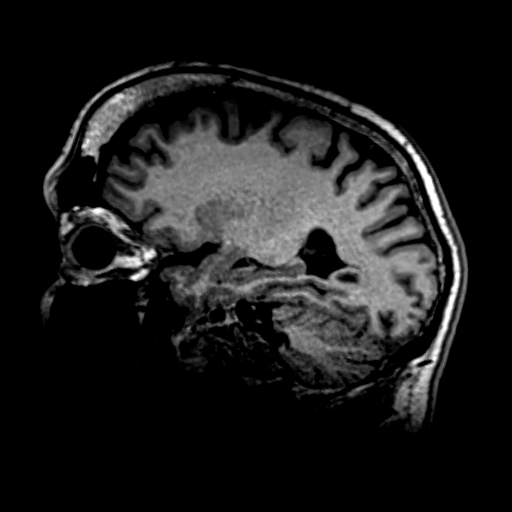
[im 141/170]
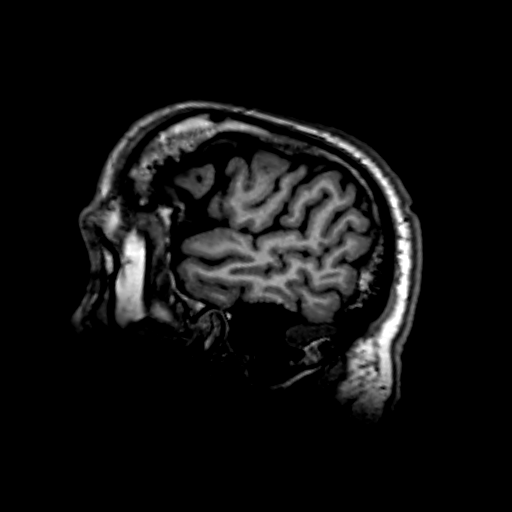

[26 of 48 positions shown; findings below may reference images not displayed]

FINDINGS: There is a heterogeneous mass in the left parietal occipital lobe measuring 2.6 x 3.0 x 3.7 cm, previously 2.4 x 1.8 x 2.4 cm (AP, transverse and superior-inferior dimensions). This mass demonstrate enhancement and containing T1 bright signal compatible with Phacyh. There is increased vasogenic edema involving the left parietal occipital and posterior temporal lobe. There is mass effect on the left lateral ventricle with a minimal midline shift.

The remaining brain parenchyma, brain stem and the cerebellum are unremarkable.

Flow voids: Flow voids are demonstrated in the major intracranial vessels.

Sinuses and mastoid air cells: The sinuses and the mastoid air cells are within normal limits.

Globes: The globes are unremarkable.

There is no MR evidence of acute infarct. No other enhancing lesions identified in the brain.
IMPRESSION: 1.
Heterogeneous enhancing mass in the left parietal occipital lobe measuring 2.6 x 3.0 x 3.7 cm, previously 2.4 x 1.8 x 2.4 cm (AP, transverse and superior-inferior dimensions). There is increased vasogenic edema involving the left parietal occipital and posterior temporal lobe. There is also mass effect on the left lateral ventricle with a minimal midline shift. Given patient's history of melanoma, progression of metastatic disease is a primary consideration.

2.
No other enhancing lesions identified in the brain.

## 2021-02-05 IMAGING — MR MRI BRAIN W/WO CONTRAST
13 series · 48 of 48 positions shown · IV contrast (prohance)
Comparison: MRI brain study 11/06/2020, and 07/02/2020.

HISTORY: 60-year-old male with malignant melanoma of scalp and neck. Metastatic disease involving the brain. Status post resection.
TECHNIQUE: Pre- and postcontrast enhanced  MRI study of the brain was performed using sagittal, coronal and axial images of varying sequences. 

IV contrast:  15 cc ProHance.

[Series 5: flair_axial_fs · axial · 4.0mm · 0.75mm/px · 1 of 32 slices shown]
[im 1/32]
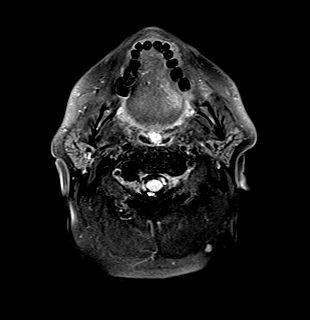

[Series 6: t2_axial · axial · 4.0mm · 0.38mm/px · 1 of 32 slices shown]
[im 1/32]
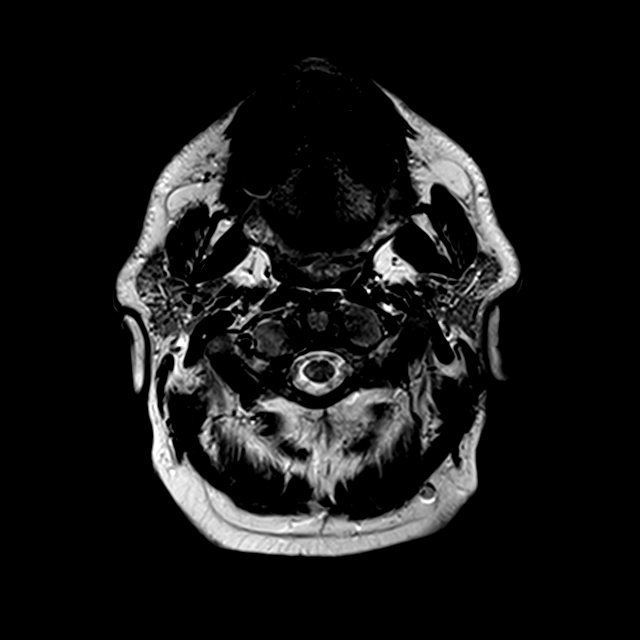

[Series 7: DWI · axial · 4.0mm · 0.86mm/px · 1 of 27 slices shown (1 of 2)]
[im 1/27]
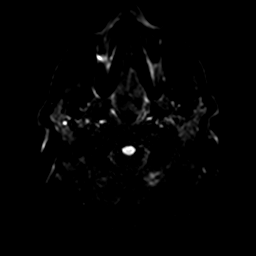

[Series 8: DWI · axial · 4.0mm · 0.86mm/px · 1 of 27 slices shown (2 of 2)]
[im 1/27]
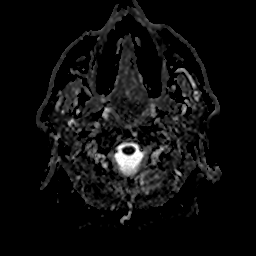

[Series 9: flash_axial · axial · 4.0mm · 0.75mm/px · 1 of 32 slices shown]
[im 1/32]
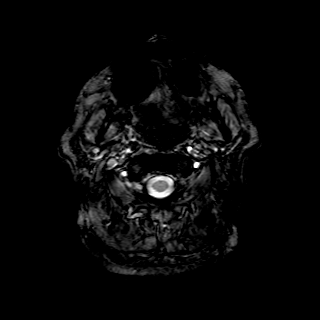

[Series 11: t1_mpr_axial_pre · axial · 1.0mm · 0.94mm/px · z∈[-60,+127]mm · 6 of 192 slices shown]
[im 1/192]
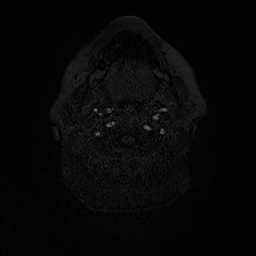
[im 39/192]
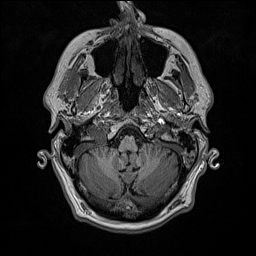
[im 77/192]
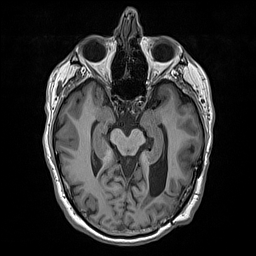
[im 115/192]
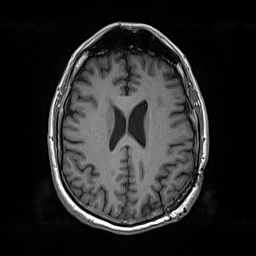
[im 153/192]
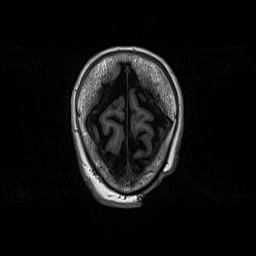
[im 192/192]
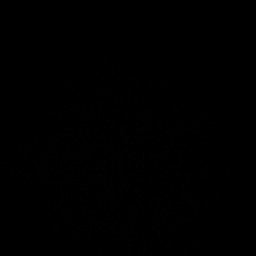

[Series 12: t1_mpr_axial_pre_mpr_sag · sagittal · 1.0mm · 0.94mm/px · 6 of 194 slices shown]
[im 1/194]
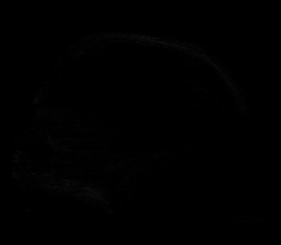
[im 39/194]
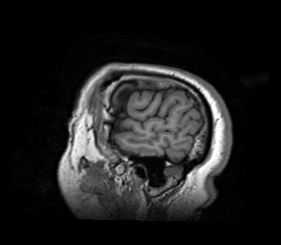
[im 78/194]
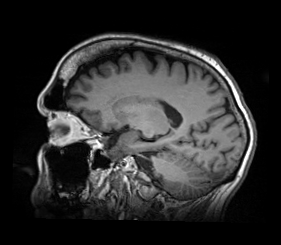
[im 116/194]
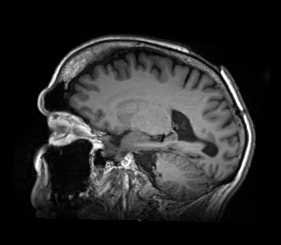
[im 155/194]
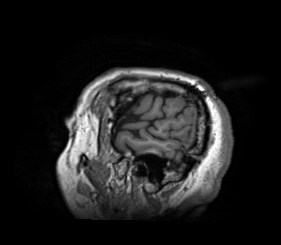
[im 194/194]
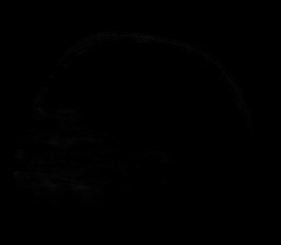

[Series 13: t1_mpr_axial_pre_mpr_cor · coronal · 1.0mm · 0.94mm/px · 6 of 200 slices shown]
[im 1/200]
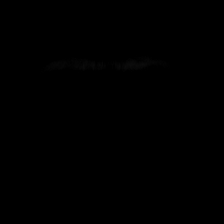
[im 40/200]
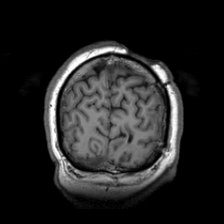
[im 80/200]
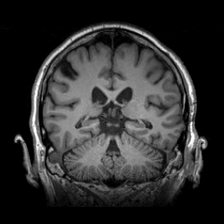
[im 120/200]
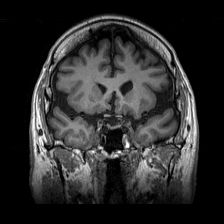
[im 160/200]
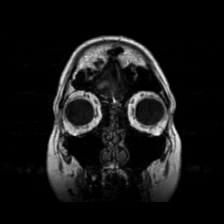
[im 200/200]
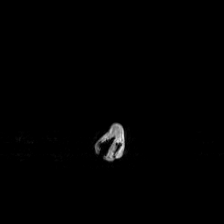

[Series 14: flair_sagittal · sagittal · 4.0mm · 0.75mm/px · 1 of 30 slices shown]
[im 1/30]
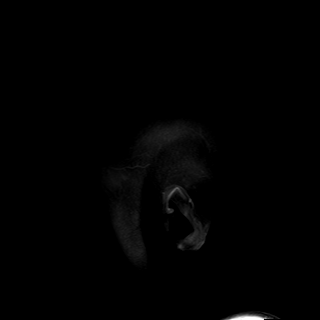

[Series 15: t1_mpr_axial+c · axial · 1.0mm · 0.94mm/px · z∈[-60,+127]mm · 6 of 192 slices shown]
[im 1/192]
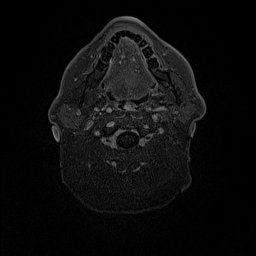
[im 39/192]
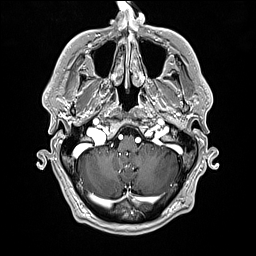
[im 77/192]
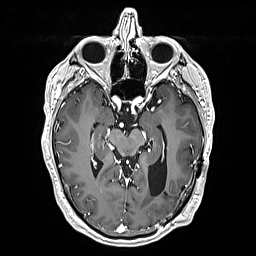
[im 115/192]
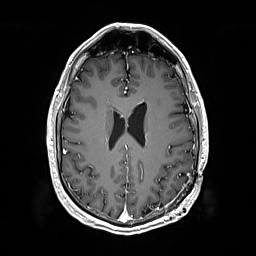
[im 153/192]
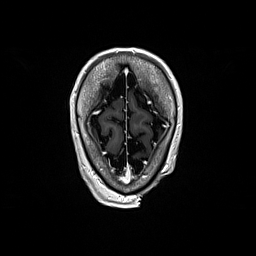
[im 192/192]
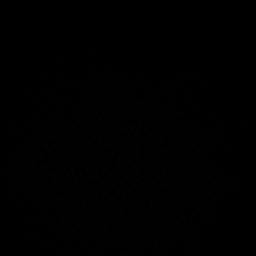

[Series 16: t1_mpr_axial+c_mpr_sag · sagittal · 1.0mm · 0.94mm/px · 6 of 198 slices shown]
[im 1/198]
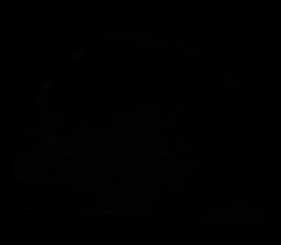
[im 40/198]
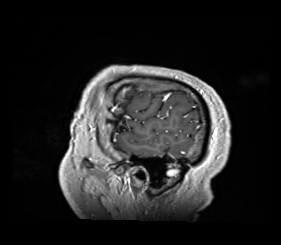
[im 79/198]
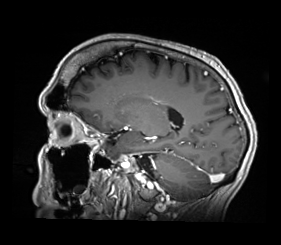
[im 119/198]
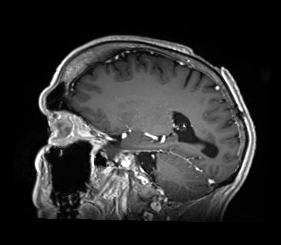
[im 158/198]
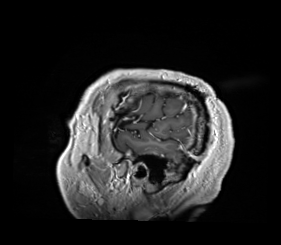
[im 198/198]
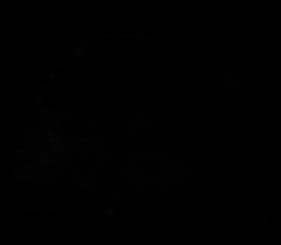

[Series 17: t1_mpr_axial+c_mpr_cor · coronal · 1.0mm · 0.94mm/px · 6 of 200 slices shown]
[im 1/200]
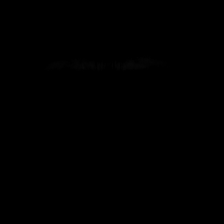
[im 40/200]
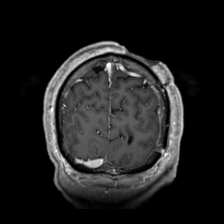
[im 80/200]
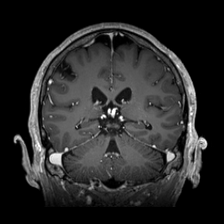
[im 120/200]
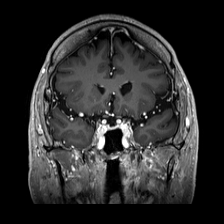
[im 160/200]
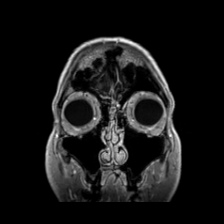
[im 200/200]
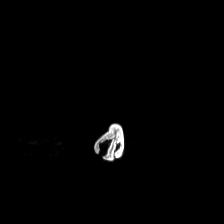

[Series 100: sub_s15-s11_t1 mpr axial+c · axial · 1.0mm · 0.94mm/px · z∈[-60,+127]mm · 6 of 192 slices shown]
[im 1/192]
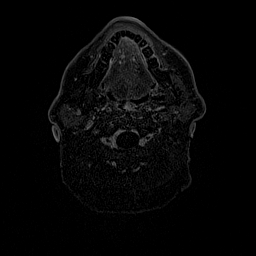
[im 39/192]
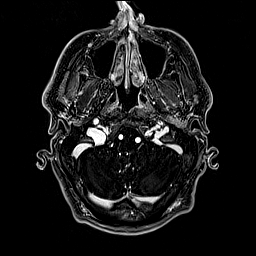
[im 77/192]
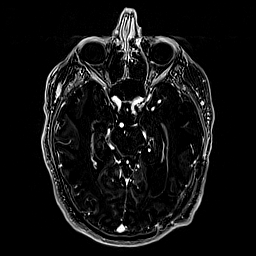
[im 115/192]
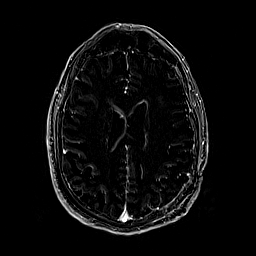
[im 153/192]
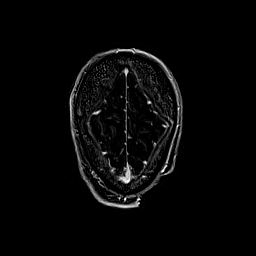
[im 192/192]
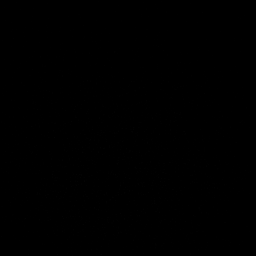

[48 of 48 positions shown; findings below may reference images not displayed]

FINDINGS: Ventricles and sulci: The ventricles and sulci are within normal limits.

Gray/white matter: T2 hyperdensities identified in the left parieto-occipital lobe. This area is associated with the enhancement measuring 1.5 x 0.7 cm in maximal AP and transverse dimensions, previously 1.3 x 1.1 cm. Postsurgical changes are favored. No MR evidence of tumor recurrence.

Brain stem, cerebellum and midline structures: The brain stem, cerebellum and the midline structures are within normal limits.

Flow voids: Flow voids are demonstrated in the major intracranial vessels.

Sinuses and mastoid air cells: There are retention cysts identified in the right maxillary sinus.

Globes: The globes are unremarkable.

There is no MR evidence of other acute infarct, hemorrhage or enhancing intracranial lesion.
IMPRESSION: 1.
T2 hyperdensities identified in the left parieto-occipital lobe corresponding to patient's area of a resection. This area is associated with peripheral enhancement measuring 1.5 x 0.7 cm, previously 1.3 x 1.1 cm. Postsurgical changes are favored. No MR evidence of tumor recurrence.

2.
Right maxillary sinus retention cysts.

## 2021-02-05 IMAGING — CT CT CHEST/ABD/PELVIS W CON
2 of 6 series · 11 of 46 positions shown, 12 images · IV contrast (agent unspecified)
Comparison: CT chest, abdomen and pelvis study 11/06/2020 and 05/06/2020 from [facility] [[facility]

HISTORY: 60-year-old male with malignant melanoma of scalp and neck.
TECHNIQUE: Postcontrast axial images of the chest, abdomen and pelvis were obtained from the lung apices through the pubic symphysis. Sagittal and coronal reformatted images were also obtained. Dose reduction technique used:  Automated exposure control and adjustment of the mA and/or kV according to patient size. CT count in previous 12-months: 2

IV contrast: 100 cc Wsovue-EG1 intravenous contrast. Serum creatinine 1.0 mg/dL.
Oral contrast: 900 ml Readi-Cat
Total radiation dose to patient is CTDIvol 35.69 mGy and DLP 971.10 mGy-cm.

[Series 4: soft tissue · axial · 0.54mm/px · z∈[+1153,+1708]mm · 8 of 135 slices shown, 9 images]
[im 12/135  soft-tissue]
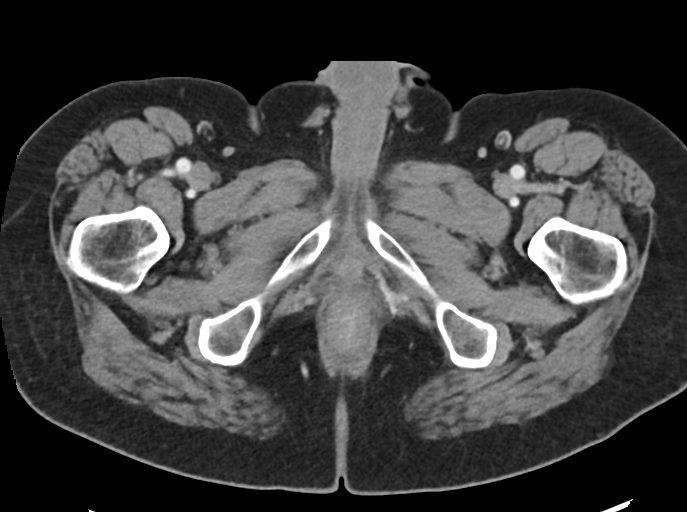
[im 12/135  bone]
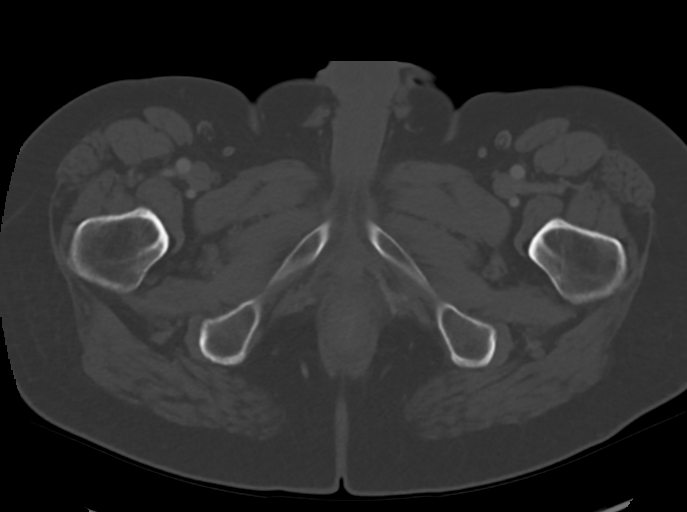
[im 34/135  soft-tissue]
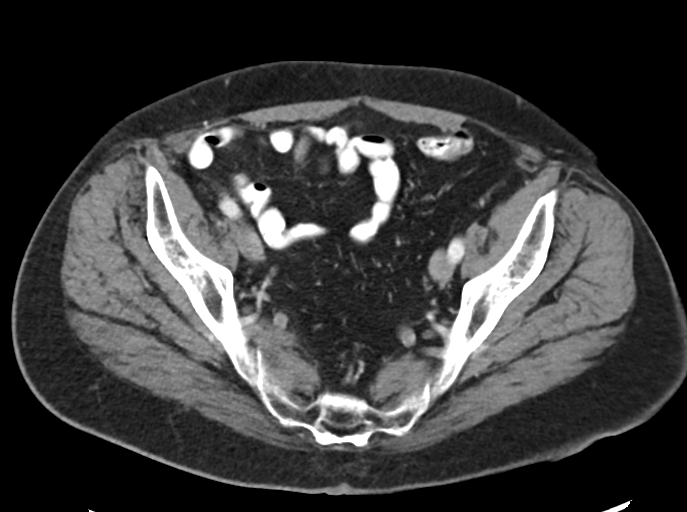
[im 45/135  soft-tissue]
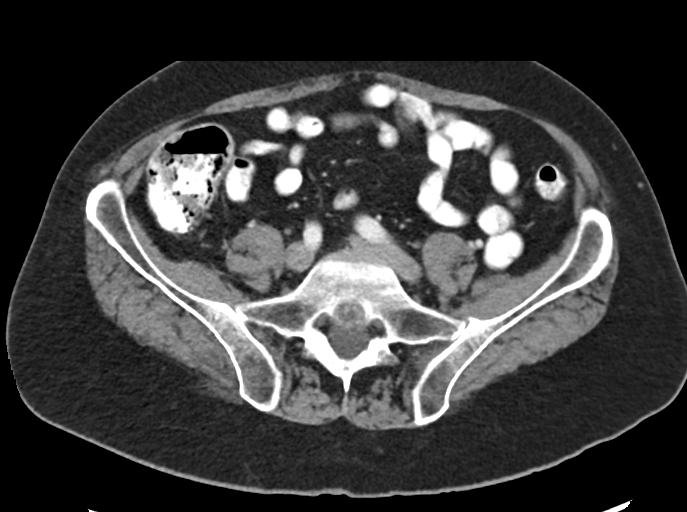
[im 56/135  soft-tissue]
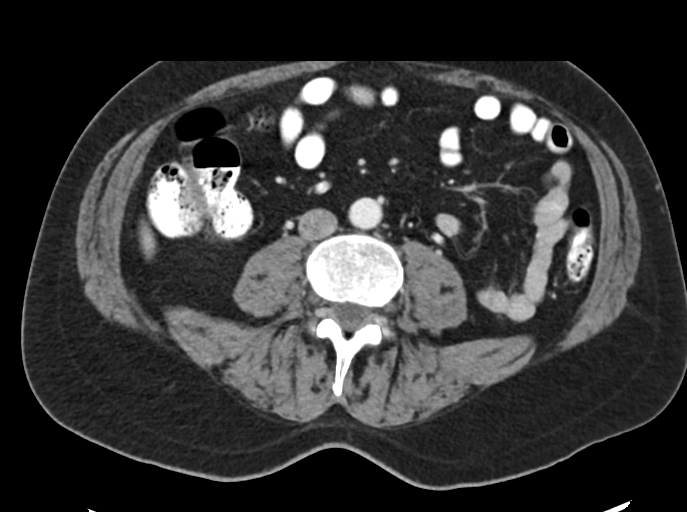
[im 79/135  soft-tissue]
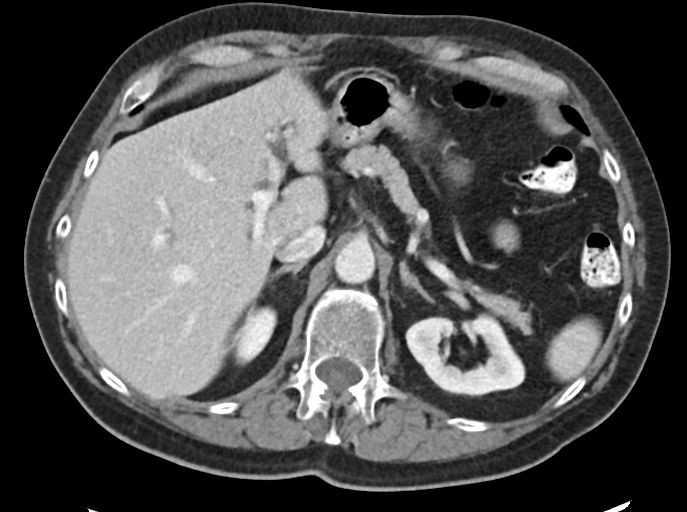
[im 90/135  soft-tissue]
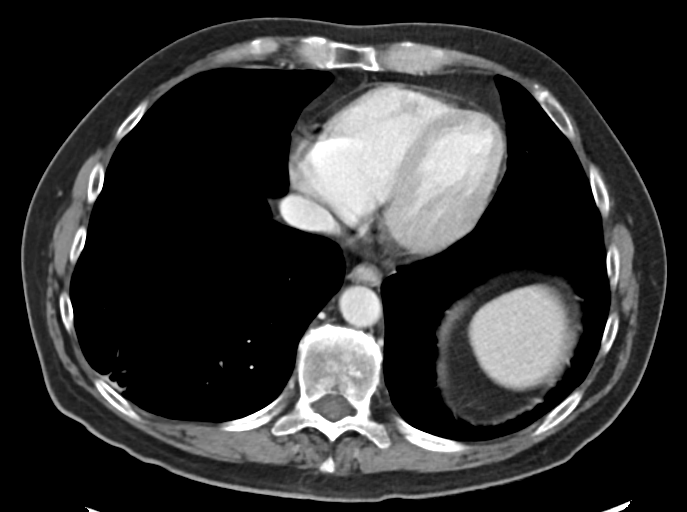
[im 101/135  soft-tissue]
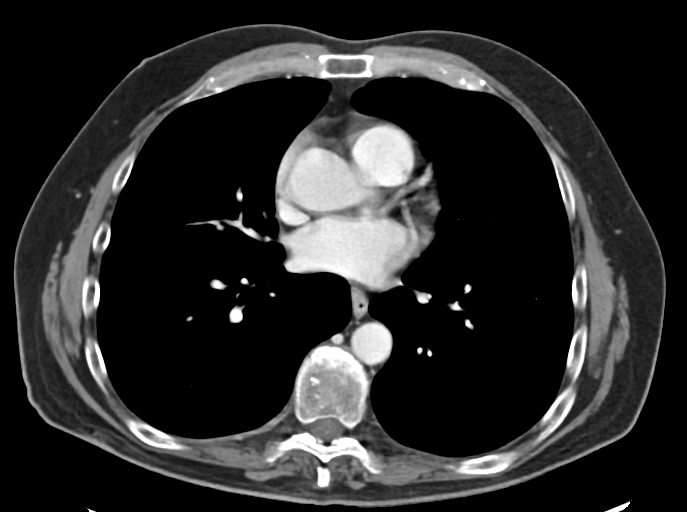
[im 123/135  soft-tissue]
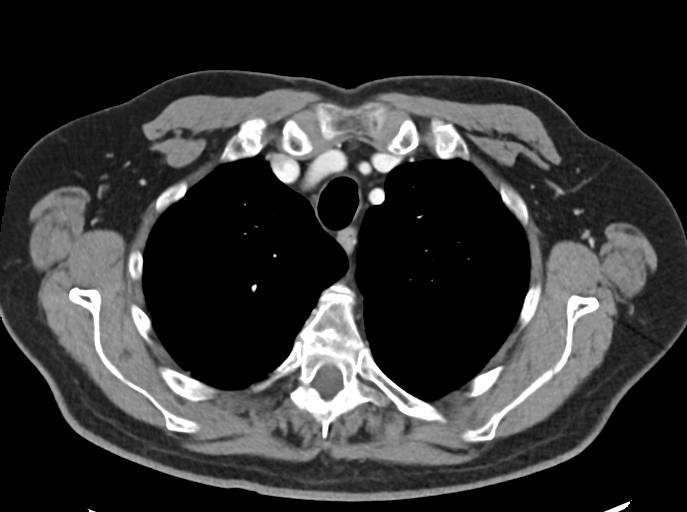

[Series 6: coronal · coronal · 0.73mm/px · 3 of 55 slices shown]
[im 19/55  soft-tissue]
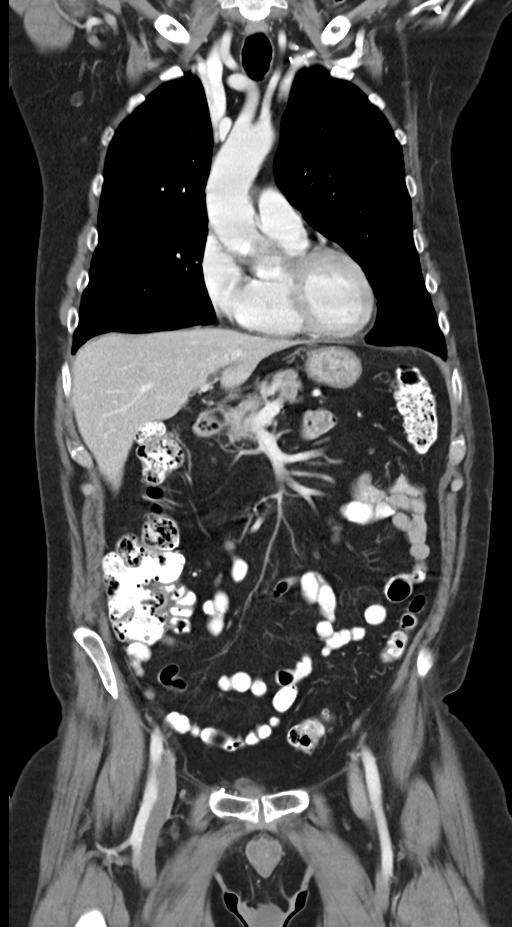
[im 25/55  soft-tissue]
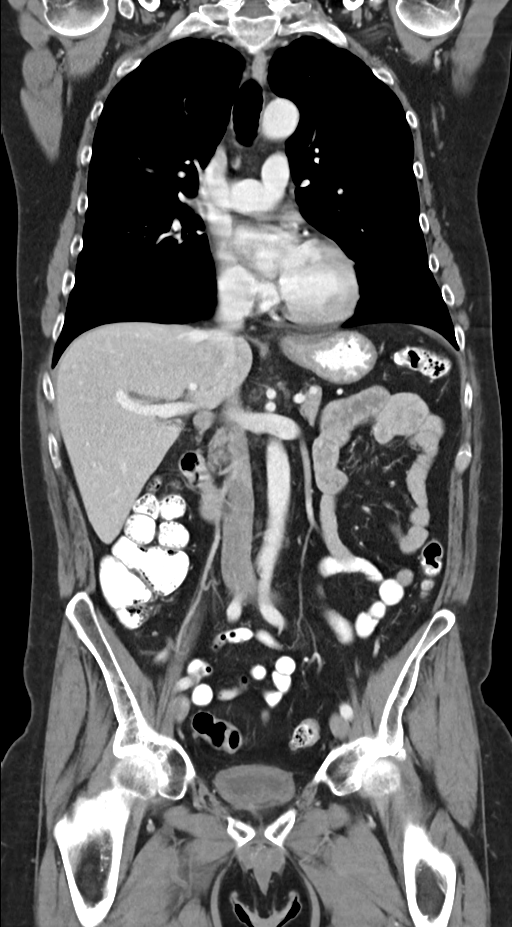
[im 31/55  soft-tissue]
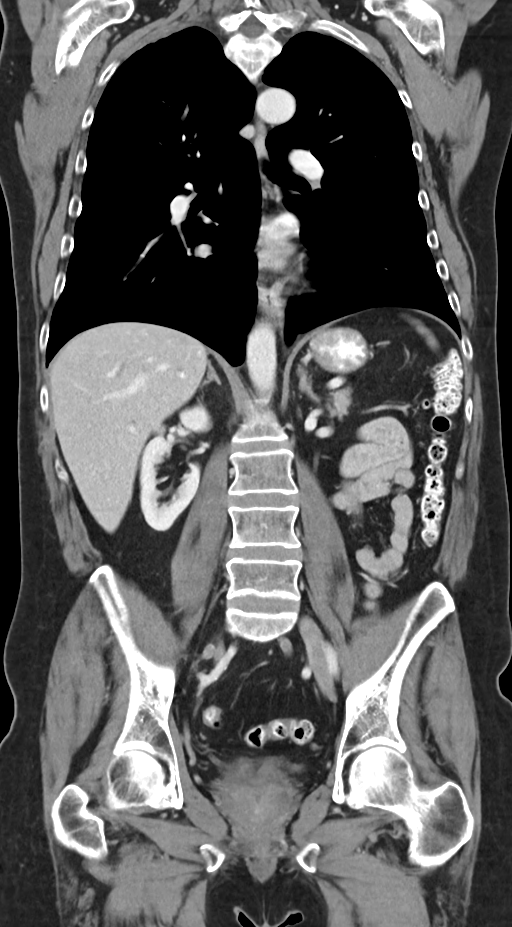

[11 of 46 positions shown; findings below may reference images not displayed]

FINDINGS: CHEST:  

Airways: Patent.

Lungs: Scarring identified in the right lower lobe unchanged compared to prior examination.

Pleura: No pleural effusion or pneumothorax.

Heart and Mediastinum: Normal heart size. No evidence of right heart strain. No significant coronary arterial calcifications. No pericardial effusion. The distal esophagus is unremarkable.

Thoracic Aorta: Normal caliber. No significant atherosclerotic calcifications. No evidence of dissection. 

Lymph nodes: Normal in size.

Lower Neck: Unremarkable.

Chest Wall: Normal.

Bones: Thoracic spine unremarkable.

ABDOMEN AND PELVIS: 

Hepatobiliary system: The liver demonstrates homogeneous enhancement without focal lesion. No intra- or extrahepatic biliary ductal dilatation. The splenic and portal veins are patent.

Gallbladder: Patient is a status post cholecystectomy.

Spleen: No splenomegaly or focal abnormalities.

Distal esophagus, stomach and duodenum: Normal CT appearance.

Pancreas: No focal masses. No pancreatic ductal dilatation. No significant peripancreatic inflammatory changes.

Adrenal glands: No focal masses.

Kidneys: Symmetric enhancement. No hydronephrosis or obstructing calculi. No other finding.

Urinary bladder: Unremarkable

Reproductive organs: Unremarkable.

Bowel: No evidence of a bowel obstruction or inflammation. Appendix not identified, but no secondary signs of appendicitis. Mild diverticulosis.

Peritoneum: No ascites, fluid collection or pneumoperitoneum.

Lymph nodes: Unremarkable.

Vasculature: Mild atherosclerotic vascular calcifications. Normal caliber of the abdominal aorta. Patent hepatic, portal, splenic and mesenteric veins.

Bones: Degenerative changes present in the lumbar spine.

Abdominal wall: Unremarkable.
IMPRESSION: 1.
Scarring identified in the right lower lobe of the lung unchanged compared to prior examination.

2.
No CT evidence of metastatic disease in the chest.

3.
No CT evidence of metastatic disease in the abdomen or pelvis.

## 2021-02-05 IMAGING — CR [HOSPITAL] SKULL
1 series · 2 of 2 positions shown · non-contrast
Comparison: None.

HISTORY/INDICATION:  MRI clearance. Prior brain surgery.
TECHNIQUE: Skull, 2 views, no charge.

[Series 1: PA · 0.17mm/px · 2 of 2 slices shown]
[im 1/2]
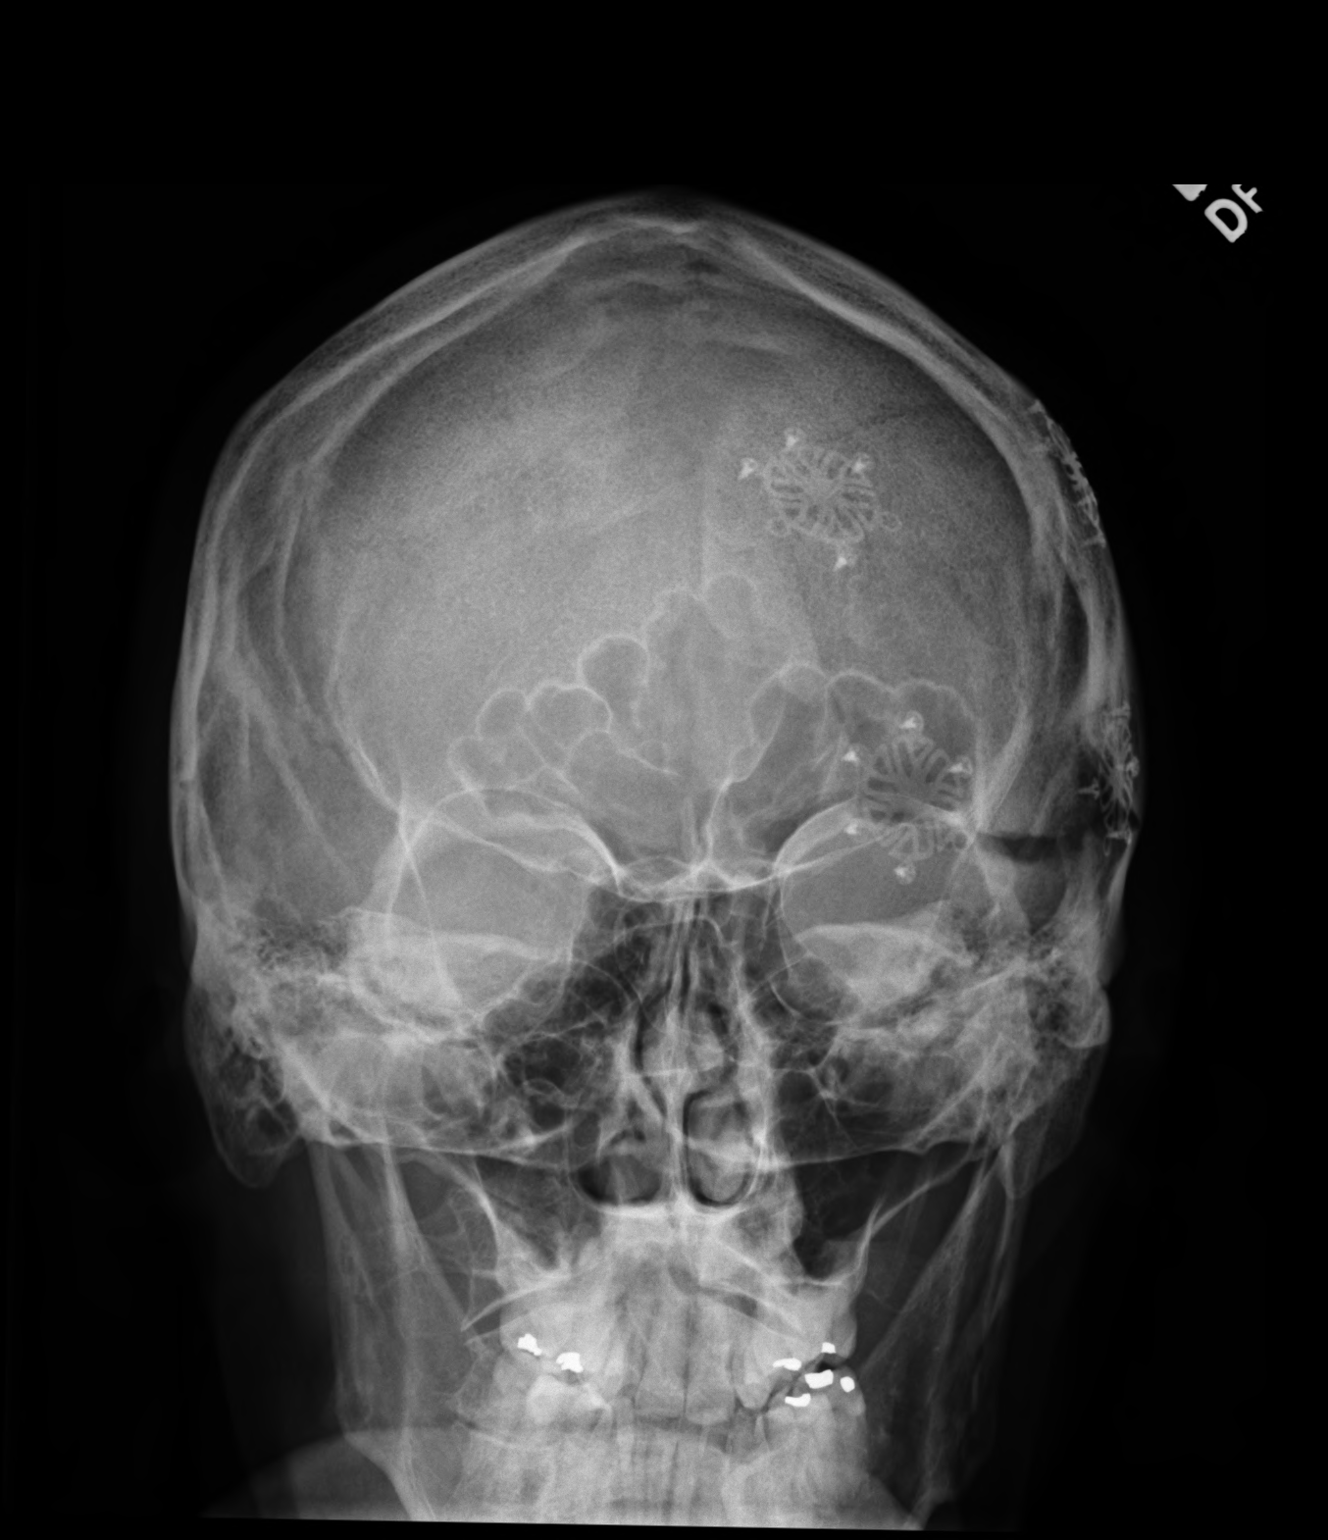
[im 2/2]
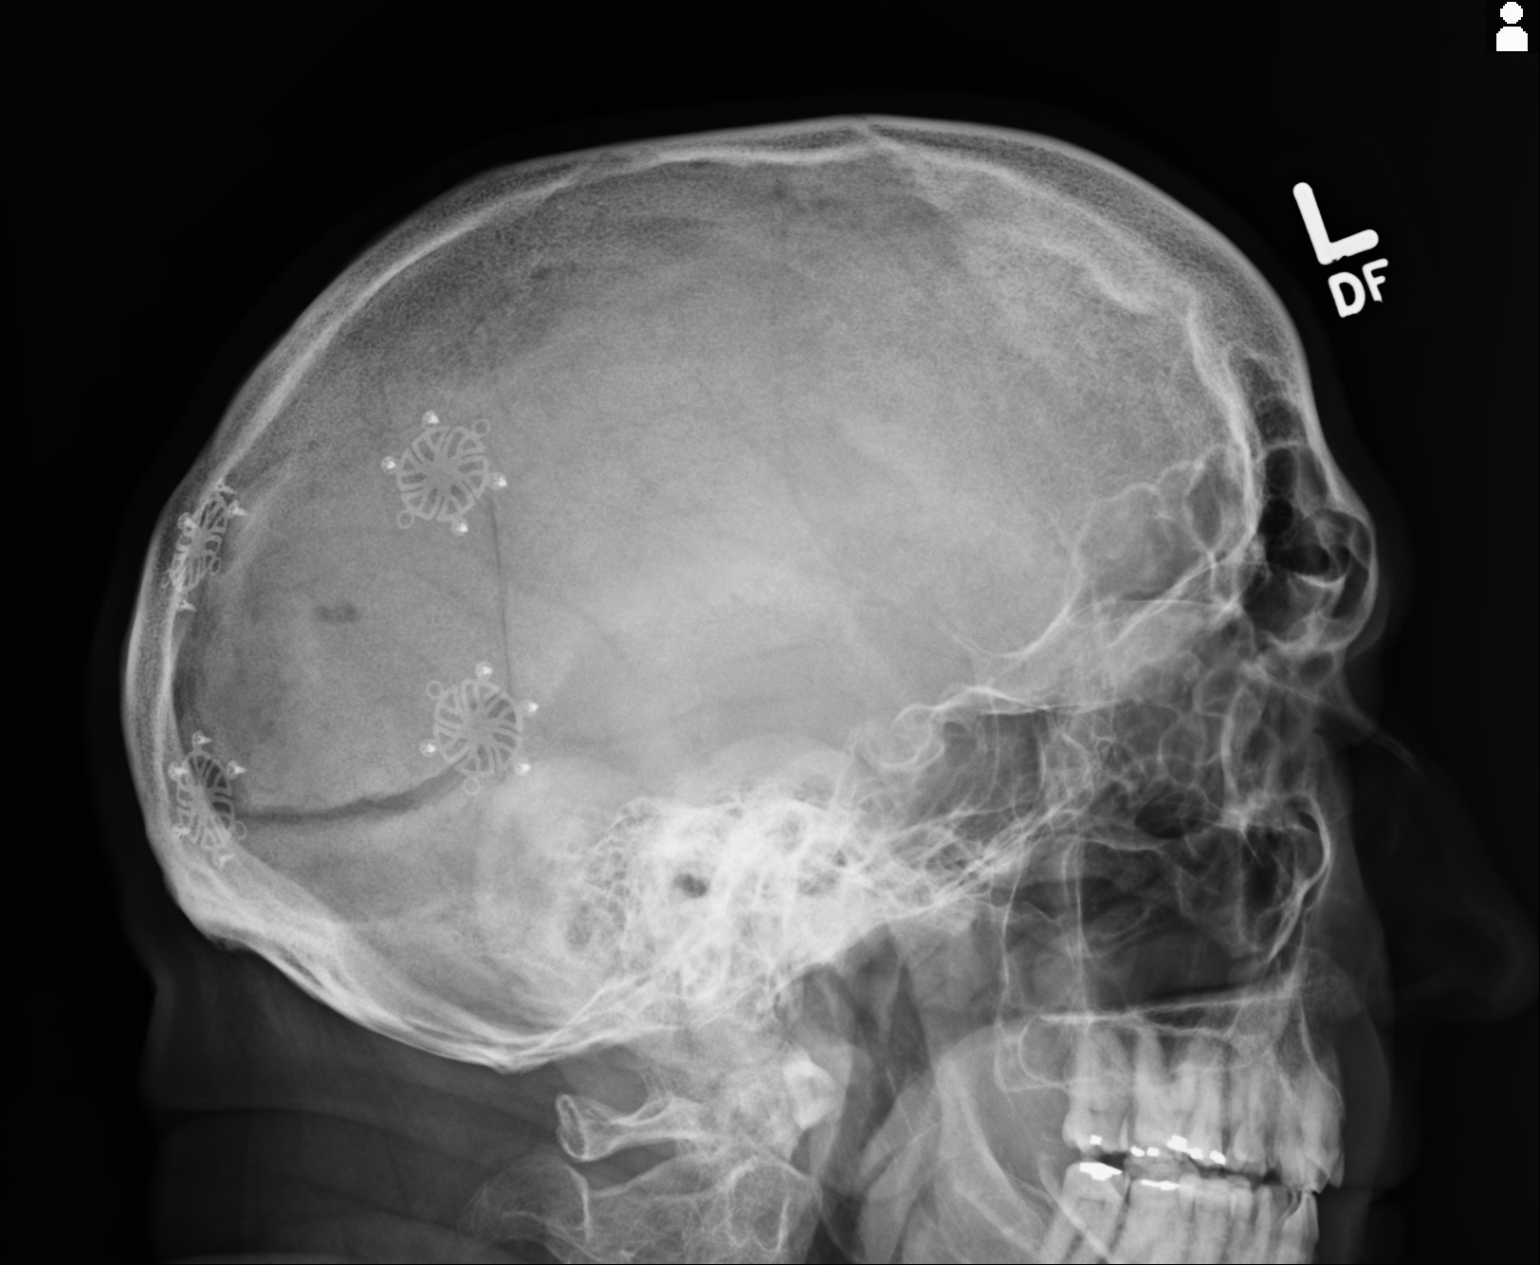

[2 of 2 positions shown; findings below may reference images not displayed]

FINDINGS: There are postoperative changes of posterior left parietal and occipital craniotomy with placement of plates to stabilize craniotomy site. No contraindications to MR are seen. No other findings are noted.
IMPRESSION: No contraindications to MR.

STAT Fax

## 2021-08-06 IMAGING — CT 03_CAP_W(Adult)
2 of 5 series · 11 of 36 positions shown, 13 images · non-contrast
Comparison: none

[Series 6: coronal · coronal · 0.75mm/px · 3 of 55 slices shown]
[im 11/55  lung]
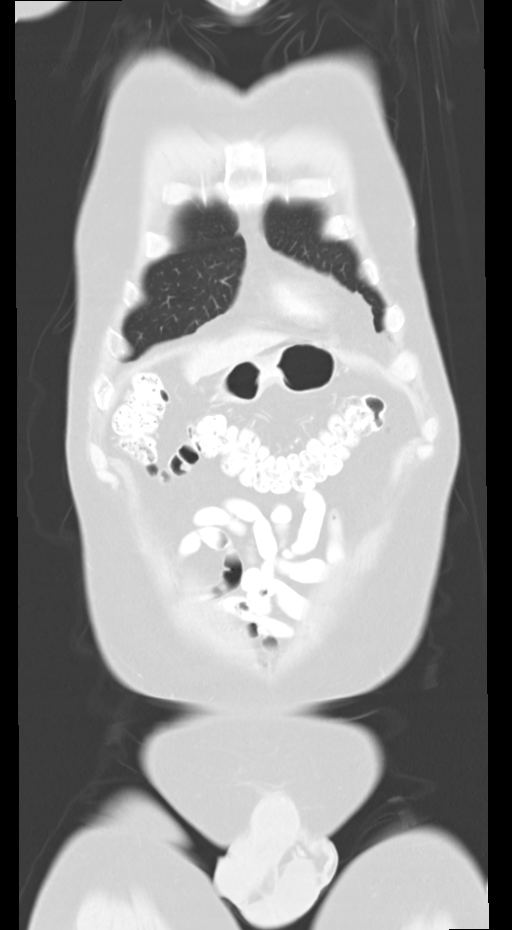
[im 22/55  lung]
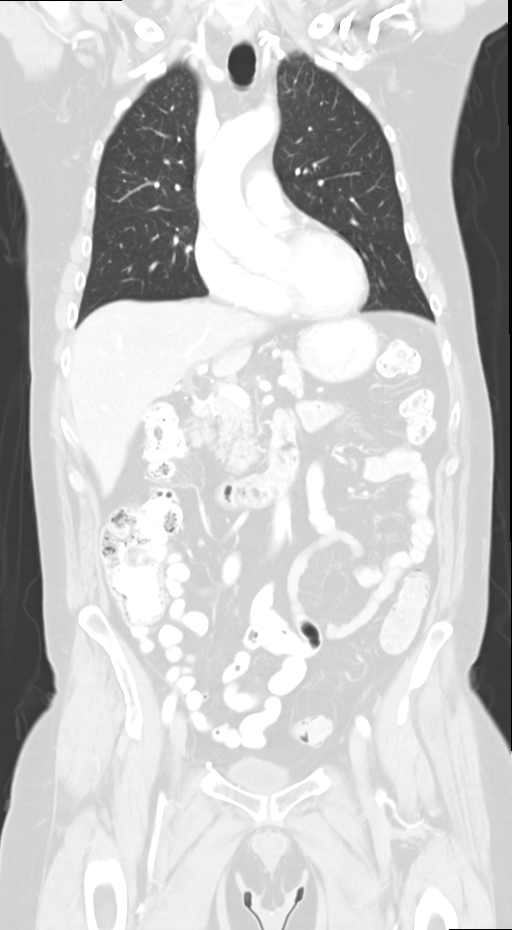
[im 33/55  lung]
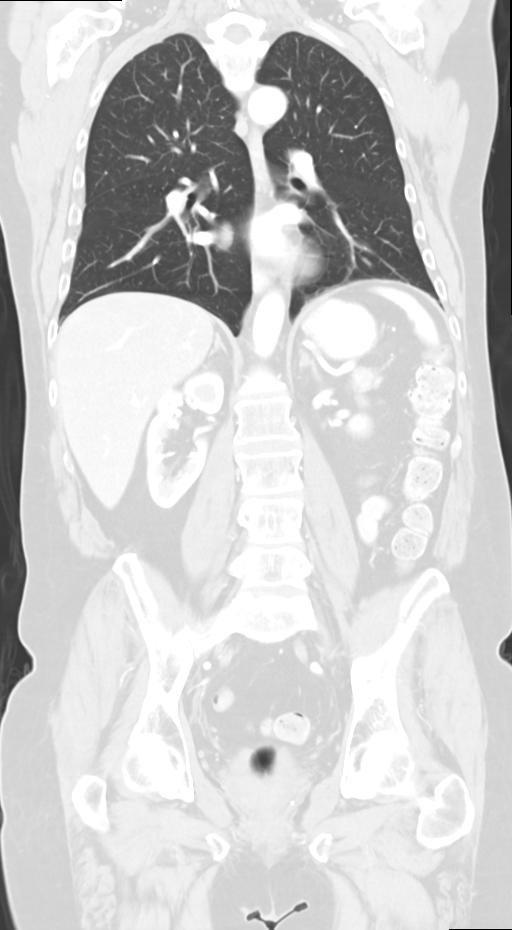

[Series 10: lung · axial · 0.54mm/px · z∈[+1495,+1743]mm · 8 of 148 slices shown, 10 images]
[im 12/148  mediastinal]
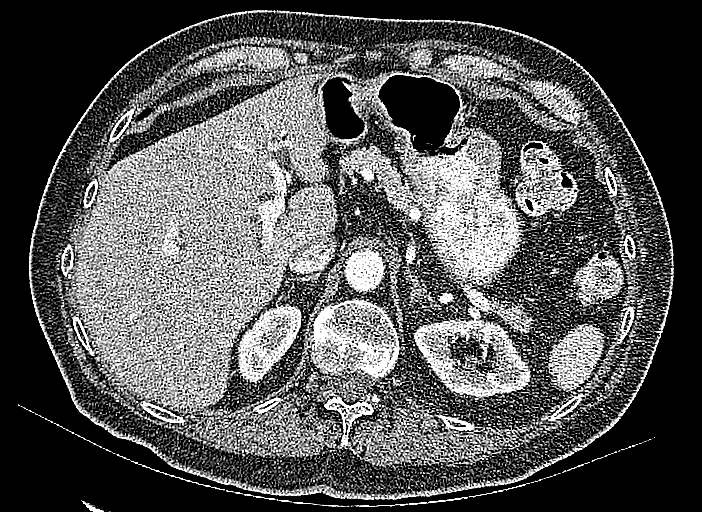
[im 12/148  lung]
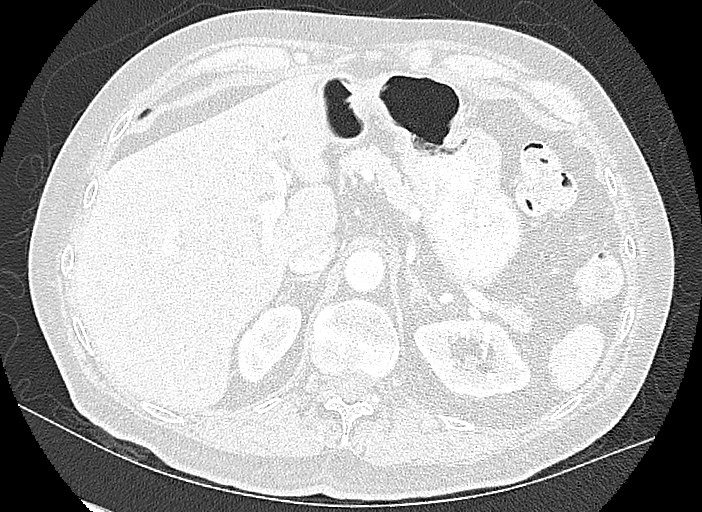
[im 34/148  lung]
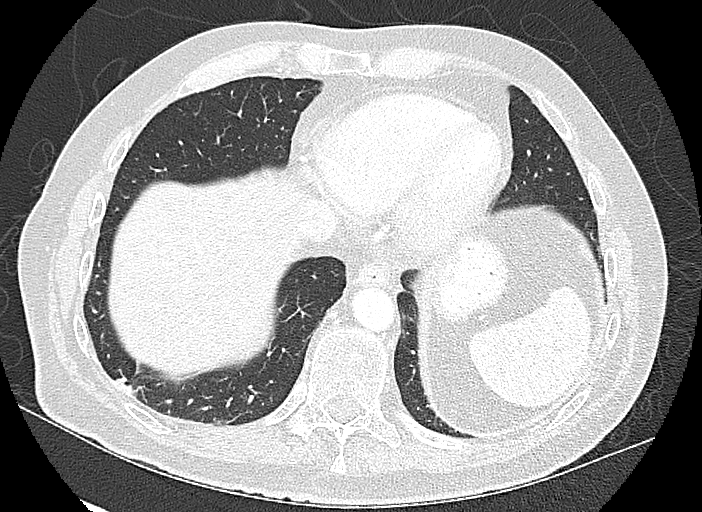
[im 46/148  lung]
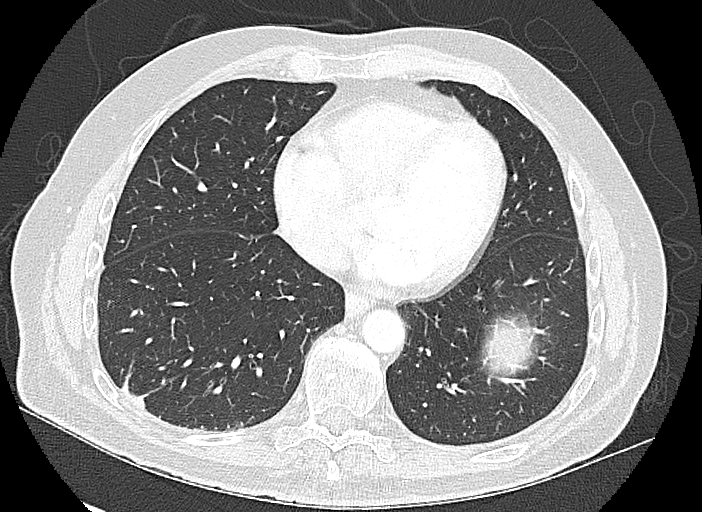
[im 68/148  lung]
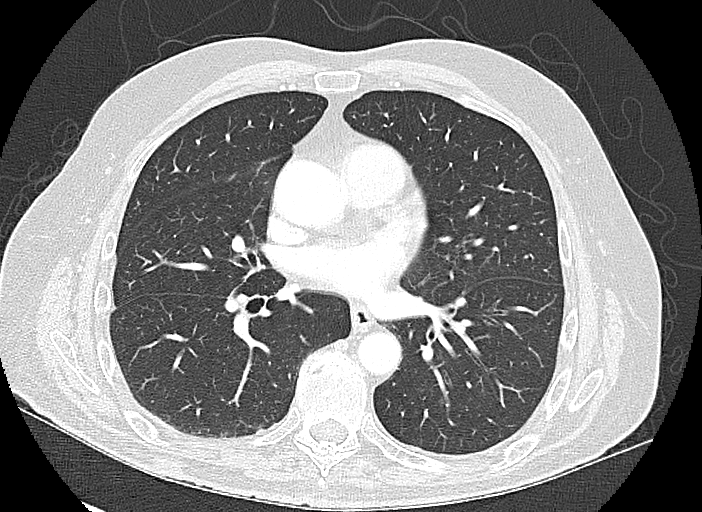
[im 80/148  mediastinal]
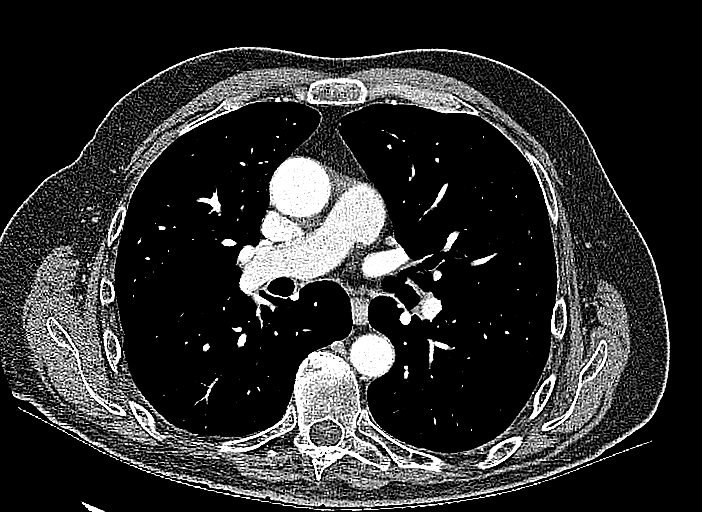
[im 80/148  lung]
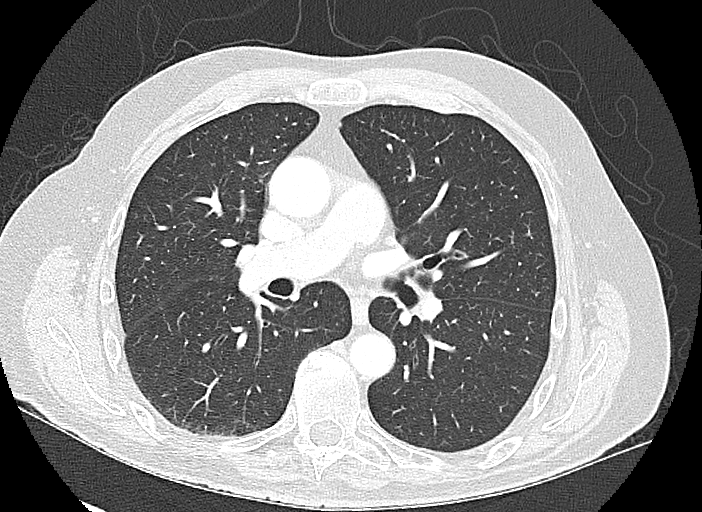
[im 102/148  lung]
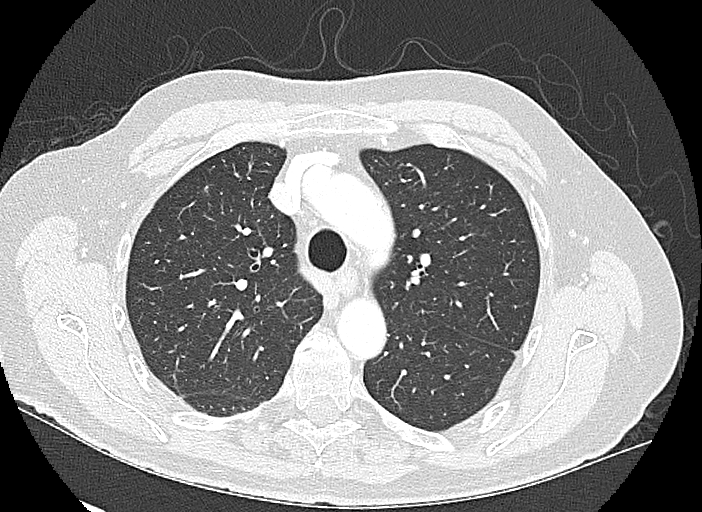
[im 114/148  lung]
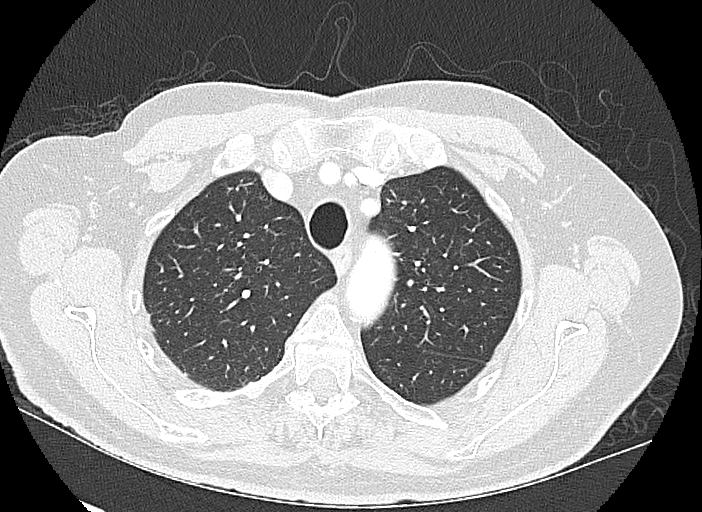
[im 136/148  lung]
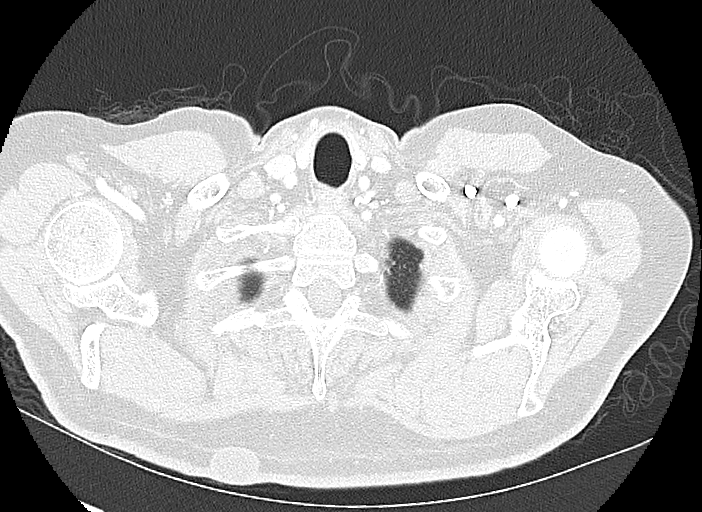

[11 of 36 positions shown; findings below may reference images not displayed]

Images Obtained from [facility]
REASON FOR EXAM
*  Malignant melanoma of scalp and neck
COMPARISON
*  CT chest, abdomen, pelvis 05/06/2021
*  CT chest, abdomen, pelvis 02/05/2021
TECHNIQUE
*  CT images of the chest, abdomen, and pelvis were acquired after the administration of 100 cc Isovue 370 IV contrast
*  Readi-Cat 900 cc oral contrast
*  i-STAT creatinine 0.9 mg/dL
*  Total radiation dose to patient is CTDIvol 20.86 mGy and DLP 763.30 mGy-cm.
*  Dose reduction technique used: Automated exposure control and adjustment of the mA and/or kV according to patient size
*    CT Studies and Cardiac Nuclear Medicine Studies in last 12-months = 3
FINDINGS
Mediastinum and Thoracic Inlet
*  No enlarged lymph nodes
*  Normal-size heart
*  Physiologic amount of pericardial fluid
*  Unremarkable esophagus
Lungs and Airways
*  Patent central airways
*  Mild stable scarring best seen in the right lower lobe
*  No suspicious nodule or mass
*  No consolidation
*  No worrisome ground-glass disease
Pleura
*  No effusion
Chest Wall and Superficial Tissues
*  Probable sebaceous cyst in the posterior cutaneous tissues at the thoracic inlet ([DATE]) 2.8 cm
*  No destructive osseous lesion
Liver
*  Smooth margins
*  No concerning lesion
Gallbladder, Pancreas, Spleen
*  Cholecystectomy with residual biliary dilatation
*  Main pancreatic duct is visible, but not significantly dilated
*  Normal-size spleen
Adrenals, Kidneys, Urinary Bladder, Reproductive
*  No adrenal nodule
*  No hydronephrosis
Gastrointestinal
*  No bowel obstruction or inflammation
Peritoneum and Retroperitoneum
*  No ascites
*  No signs of peritoneal implants
Bones and Superficial Tissues
*  Right inguinal hernia repair
*  No destructive lesion in the lumbar spine or pelvic bones
IMPRESSION
*  No metastatic disease in the chest, abdomen, or pelvis

## 2021-08-06 IMAGING — CR [HOSPITAL] SKULL
1 series · 2 of 2 positions shown · non-contrast
Comparison: Skull x-ray study 02/05/2021.

Images Obtained from [facility]
HISTORY: MRI clearance
TECHNIQUE: Skull x-ray 2 views.

[Series 1: PA · 0.17mm/px · 2 of 2 slices shown]
[im 1/2]
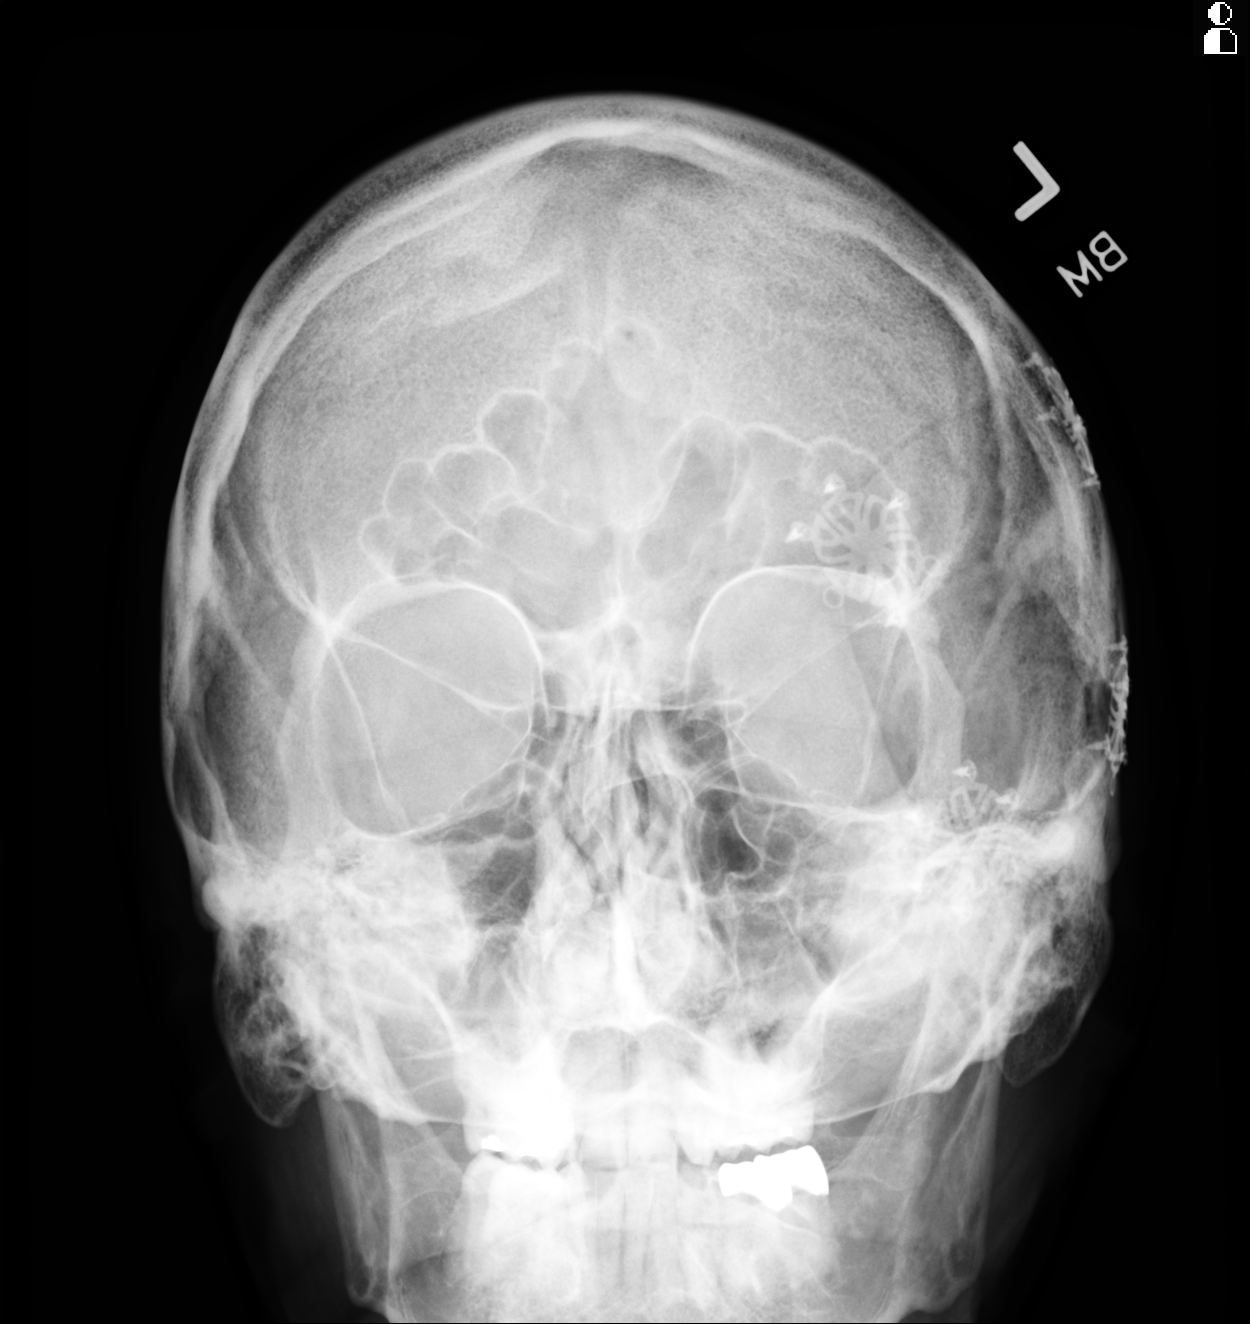
[im 2/2]
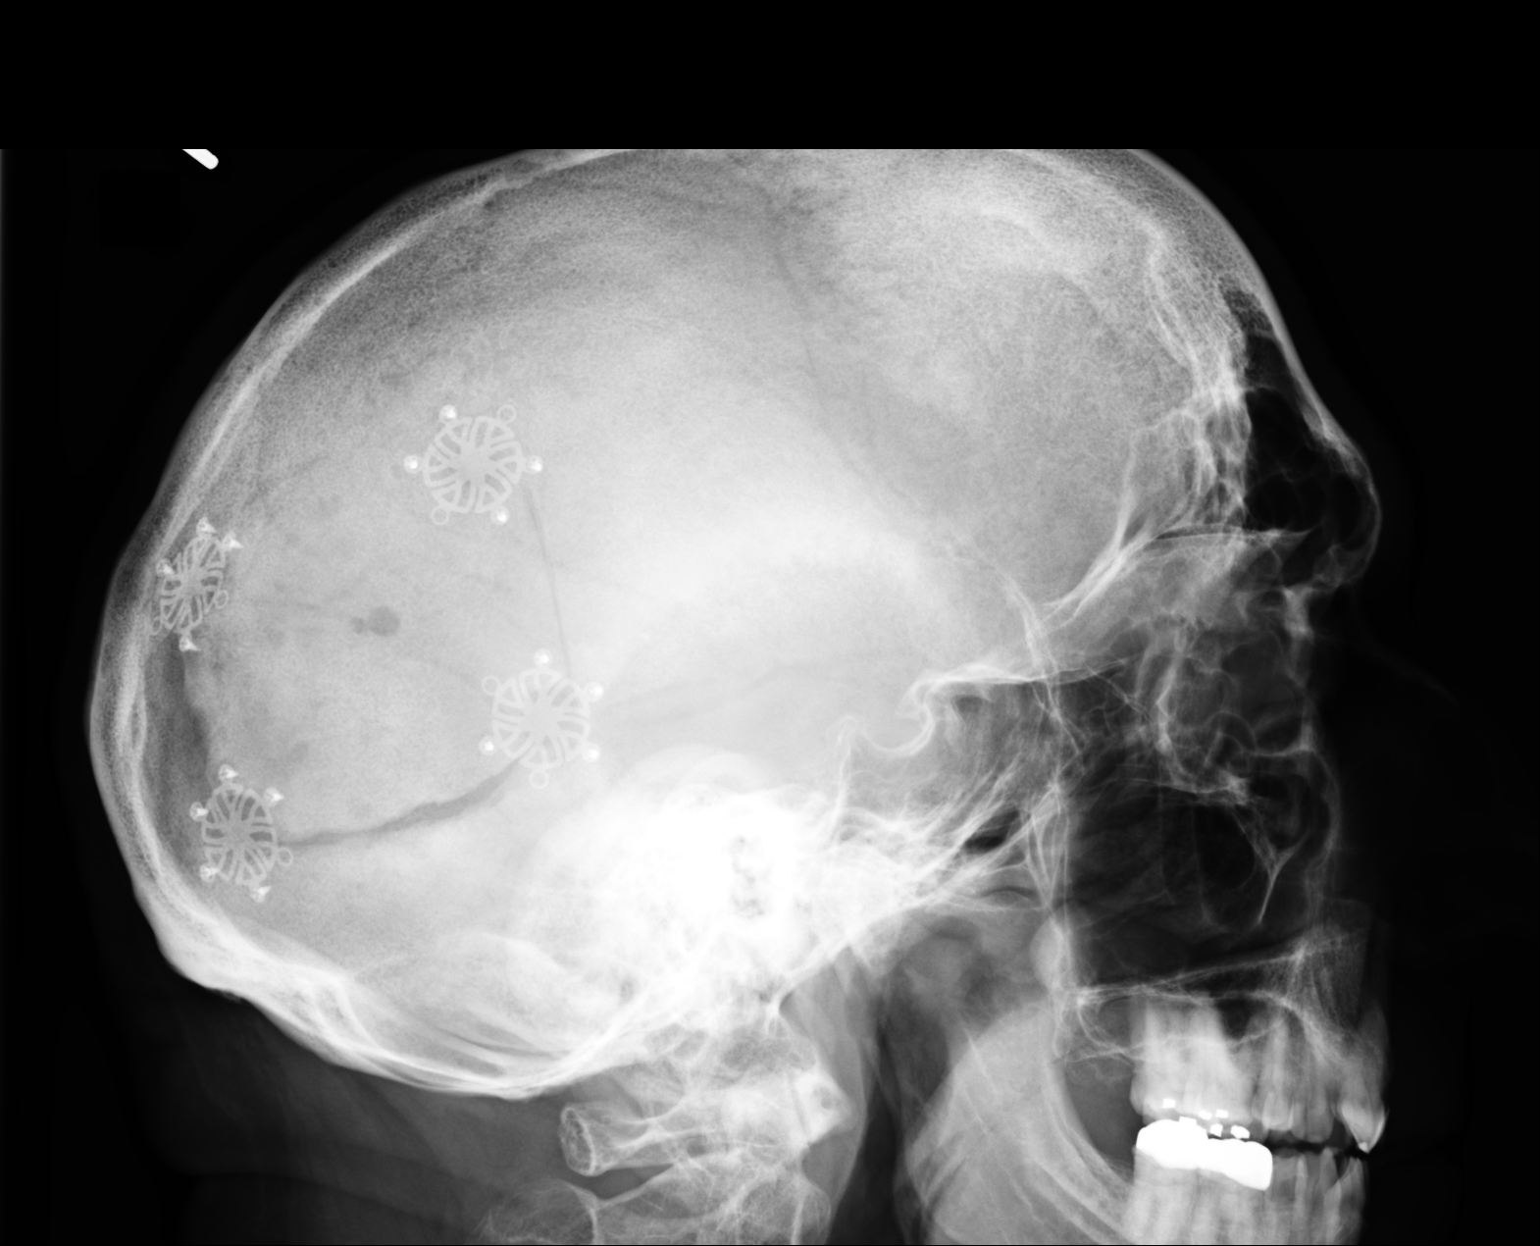

[2 of 2 positions shown; findings below may reference images not displayed]

FINDINGS: Postsurgical changes of left parietal and occipital craniotomy with fixation plates and screws again seen. Dental works identified. No other metallic foreign body seen. Paranasal sinuses
are clear.
IMPRESSION: 1.  Interval postsurgical changes of left parietal and occipital craniotomy as noted.
2.  No other interval change.
3.  Patient is clear for MRI study.

## 2021-08-06 IMAGING — MR MRI BRAIN W/WO CONTRAST
11 series · 48 of 48 positions shown · IV contrast (prohance)
Comparison: Brain MRI 05/06/2021, 02/05/2021, 11/06/2020

Images Obtained from [facility]
REASON FOR EXAM: Malignant melanoma of scalp and neck.
TECHNIQUE: Multiplanar, multisequence MRI images of the brain are obtained prior to and following intravenous contrast.
CONTRAST: 15 mL of ProHance

[Series 5: flair_axial_fs · axial · 4.0mm · 0.75mm/px · z∈[-16,+140]mm · 2 of 33 slices shown]
[im 1/33]
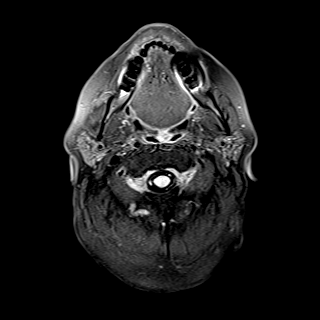
[im 33/33]
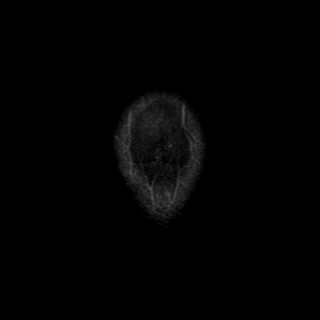

[Series 6: t2_axial · axial · 4.0mm · 0.38mm/px · 1 of 33 slices shown]
[im 1/33]
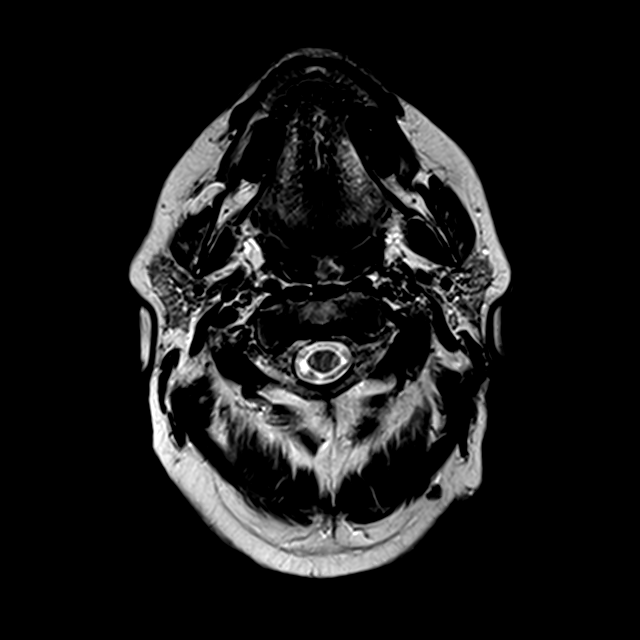

[Series 7: DWI · axial · 4.0mm · 0.94mm/px · 1 of 30 slices shown (1 of 2)]
[im 1/30]
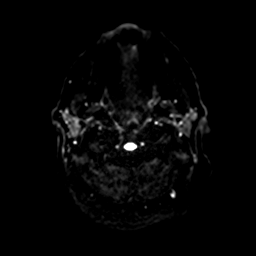

[Series 8: DWI · axial · 4.0mm · 0.94mm/px · 1 of 30 slices shown (2 of 2)]
[im 1/30]
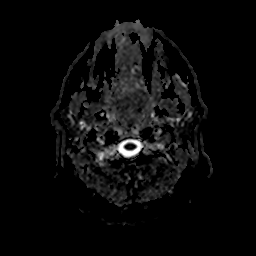

[Series 9: flash_axial · axial · 4.0mm · 0.75mm/px · 1 of 33 slices shown]
[im 1/33]
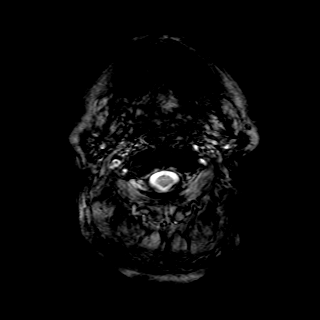

[Series 10: t1_mpr_axial_pre · axial · 1.0mm · 0.94mm/px · z∈[-28,+151]mm · 7 of 192 slices shown]
[im 1/192]
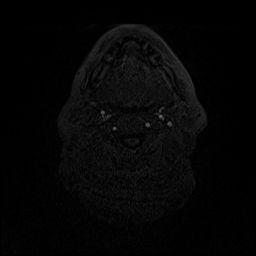
[im 32/192]
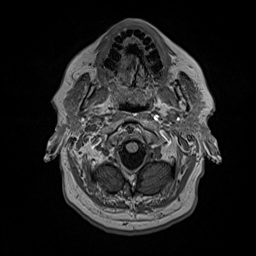
[im 64/192]
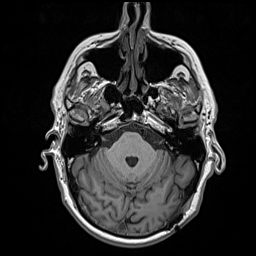
[im 96/192]
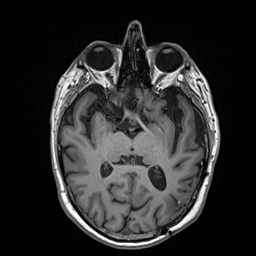
[im 128/192]
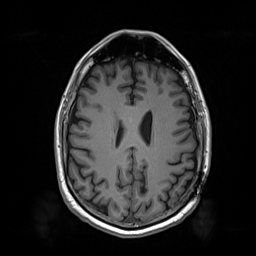
[im 160/192]
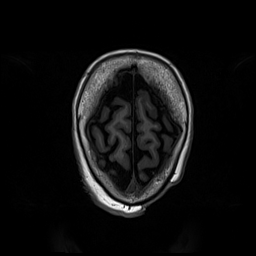
[im 192/192]
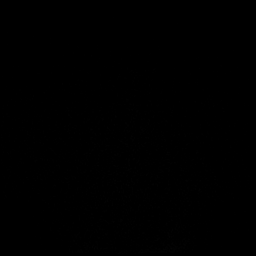

[Series 11: t1_mpr_axial_pre_mpr_sag · sagittal · 1.0mm · 0.94mm/px · 7 of 193 slices shown]
[im 1/193]
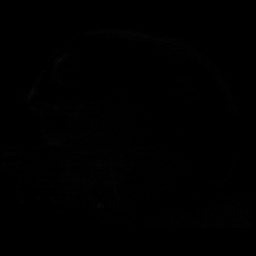
[im 33/193]
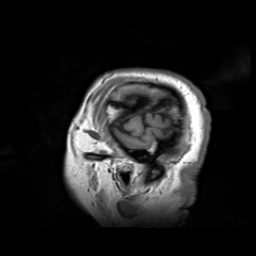
[im 65/193]
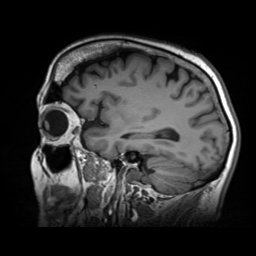
[im 97/193]
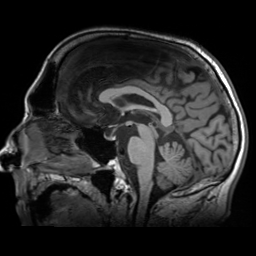
[im 129/193]
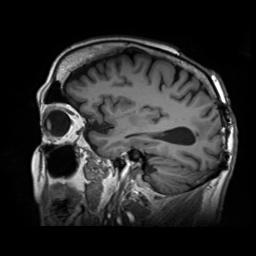
[im 161/193]
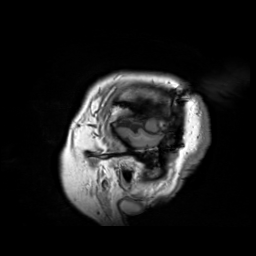
[im 193/193]
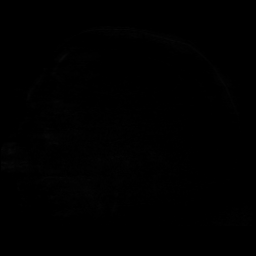

[Series 12: t1_mpr_axial_pre_mpr_cor · coronal · 1.0mm · 0.94mm/px · 7 of 200 slices shown]
[im 1/200]
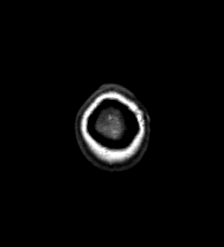
[im 34/200]
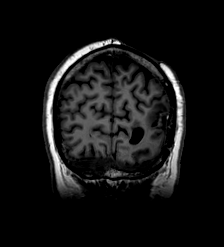
[im 67/200]
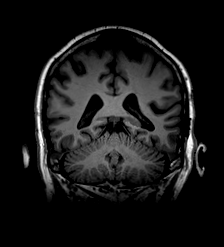
[im 100/200]
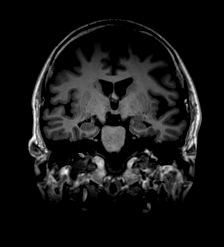
[im 133/200]
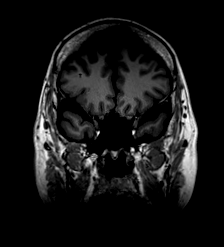
[im 166/200]
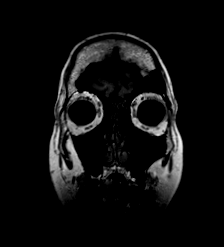
[im 200/200]
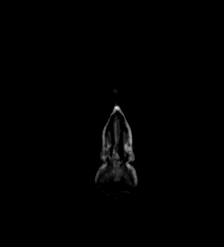

[Series 13: t1_mpr_axial+c · axial · 1.0mm · 0.94mm/px · z∈[-28,+151]mm · 7 of 192 slices shown]
[im 1/192]
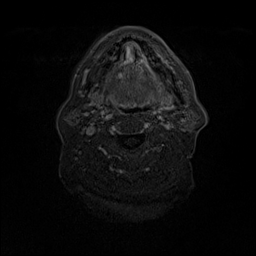
[im 32/192]
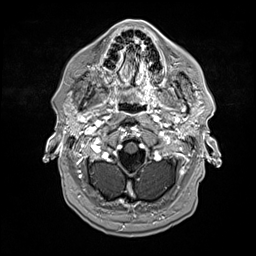
[im 64/192]
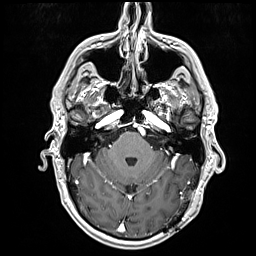
[im 96/192]
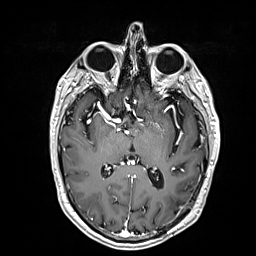
[im 128/192]
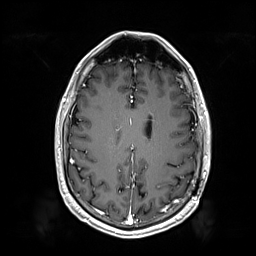
[im 160/192]
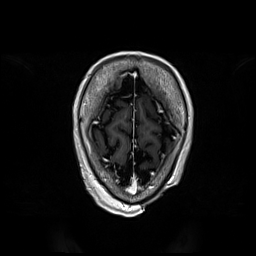
[im 192/192]
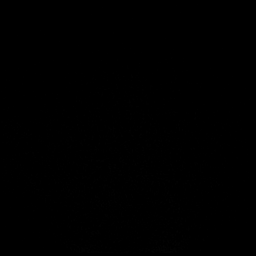

[Series 14: t1_mpr_axial+c_mpr_sag · sagittal · 1.0mm · 0.94mm/px · 7 of 196 slices shown]
[im 1/196]
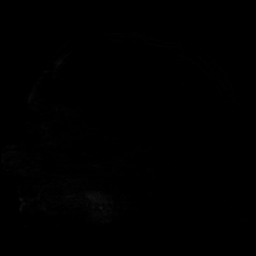
[im 33/196]
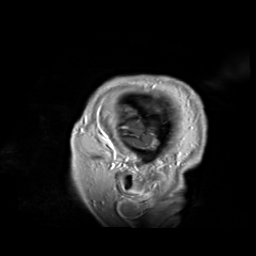
[im 66/196]
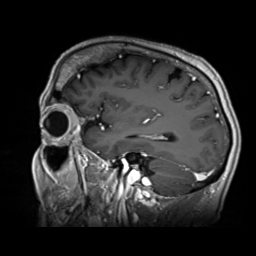
[im 98/196]
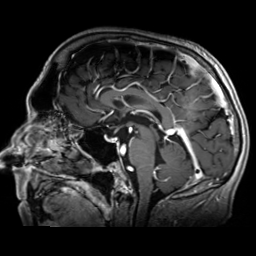
[im 131/196]
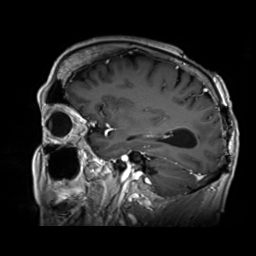
[im 163/196]
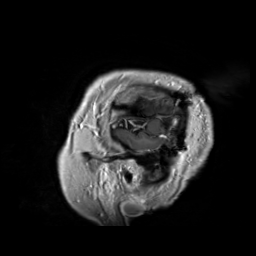
[im 196/196]
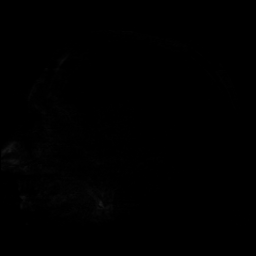

[Series 15: t1_mpr_axial+c_mpr_cor · coronal · 1.0mm · 0.94mm/px · 7 of 200 slices shown]
[im 1/200]
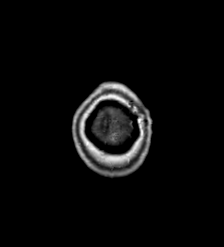
[im 34/200]
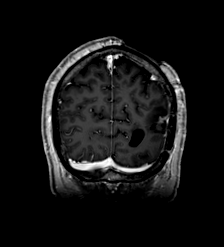
[im 67/200]
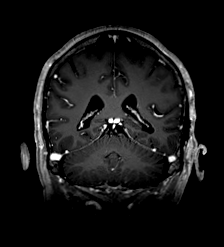
[im 100/200]
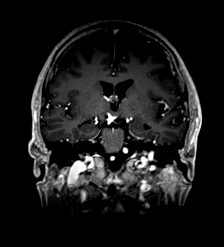
[im 133/200]
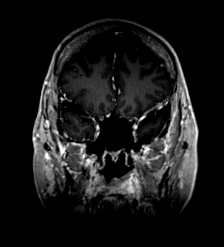
[im 166/200]
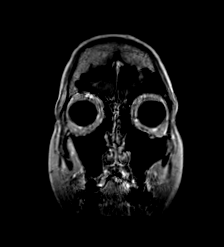
[im 200/200]
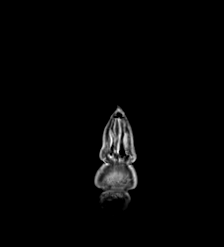

[48 of 48 positions shown; findings below may reference images not displayed]

FINDINGS: BRAIN PARENCHYMA, EXTRA-AXIAL SPACES AND CALVARIUM:
*\X09\No acute infarct
*\X09\Previous left parietal craniotomy. Underlying area of enhancement has decreased in size when compared to multiple previous examinations, now measuring approximately 9 x 5 x 8 mm in dimension,
previously 16 x 7 x 9 mm, respectively on brain MRI 05/06/2021
*\X09\Focus of susceptibility artifact in the right frontal lobe is nonspecific and is similar to prior, suggestive of previous petechial hemorrhage.
PITUITARY:
*\X09\ Unremarkable.
VASCULATURE:
*\X09\Expected major intracranial flow voids are present.
VENTRICULAR SYSTEM:
*\X09\No hydrocephalus or midline shift.
ORBITS:
*\X09\ Unremarkable.
PARANASAL SINUSES AND MASTOID AIR CELLS:
*\X09\Mild right maxillary sinus disease.
OTHER BONES:
*\X09\Unremarkable.
IMPRESSION: Previous left parietal craniotomy with underlying area of enhancement, decreased in size when compared to previous examination on 05/06/2021. No new enhancing lesions identified.

## 2021-09-29 ENCOUNTER — Ambulatory Visit
Admit: 2021-09-29 | Discharge: 2021-09-29 | Payer: PRIVATE HEALTH INSURANCE | Attending: Podiatrist | Primary: Nurse Practitioner

## 2021-09-29 DIAGNOSIS — M722 Plantar fascial fibromatosis: Secondary | ICD-10-CM

## 2021-09-29 MED ORDER — METHYLPREDNISOLONE 4 MG PO TBPK
4 MG | PACK | ORAL | 0 refills | Status: AC
Start: 2021-09-29 — End: ?

## 2021-09-29 MED ORDER — ANKLE SPLINT/NIGHT AIRFORM MISC
Freq: Every evening | 0 refills | Status: AC
Start: 2021-09-29 — End: ?

## 2021-09-29 NOTE — Progress Notes (Signed)
Subjective:    Gabriel Howell is a 61 y.o. male who presents to the office today complaining of Left heel pain.  Symptoms began 2 month(s) ago.  Patient relates pain is Present upon arising from bed in the morning and after periods of rest and then standing. The pain progresses throughout the day.  Pain is rated 4 out of 10 and is described as intermittent.  Treatments prior to today's visit include: ice. Currently denies F/C/N/V.   No Known Allergies    Past Medical History:   Diagnosis Date    Hyperlipidemia     Hypertension     Type 2 diabetes mellitus without complication ([place])        Prior to Admission medications    Medication Sig Start Date End Date Taking? Authorizing Provider   loratadine (CLARITIN) 10 MG capsule Take 1 capsule by mouth daily   Yes Historical Provider, MD   olmesartan (BENICAR) 40 MG tablet Take 1 tablet by mouth daily   Yes Historical Provider, MD   empagliflozin (JARDIANCE) 25 MG tablet Take 1 tablet by mouth daily   Yes Historical Provider, MD   amLODIPine (NORVASC) 10 MG tablet Take 0.5 tablets by mouth daily 10/10/19  Yes Shon Hale, APRN - CNP   metFORMIN (GLUCOPHAGE) 1000 MG tablet TAKE 1 TABLET BY MOUTH TWO TIMES A DAY WITH MEALS 10/10/19  Yes Shon Hale, APRN - CNP   hydroCHLOROthiazide (HYDRODIURIL) 25 MG tablet TAKE 1 TABLET BY MOUTH ONE TIME A DAY 07/12/19  Yes Shon Hale, APRN - CNP   glimepiride (AMARYL) 4 MG tablet Take 1 tablet by mouth every morning (before breakfast) 05/17/19  Yes Earley Abide, MD   simvastatin (ZOCOR) 40 MG tablet TAKE 1 TABLET BY MOUTH IN THE EVENING  10/12/18  Yes Anne Hahn, MD   losartan (COZAAR) 100 MG tablet TAKE 1 TABLET BY MOUTH ONE TIME A DAY  Patient not taking: Reported on 09/29/2021 10/10/19   Shon Hale, APRN - CNP   simvastatin (ZOCOR) 40 MG tablet TAKE 1 TABLET BY MOUTH IN THE EVENING  10/12/18  Yes Anne Hahn, MD       Past Surgical History:   Procedure Laterality Date    COLONOSCOPY  10/2015    polyps- repeat in a year-  Dr Chauncey [facility]    MALIGNANT SKIN LESION EXCISION Right 1999    [facility]       History reviewed. No pertinent family history.    Social History     Tobacco Use    Smoking status: Never    Smokeless tobacco: Never   Substance Use Topics    Alcohol use: Yes     Comment: one beer per week       ROS: All 14 ROS systems reviewed and pertinent positives noted above, all others negative.    Lower Extremity Physical Examination:     Vitals:   Vitals:    09/29/21 1311   BP: 134/78   Pulse: 81     General: AAO x 3 in NAD.     Vascular: DP and PT pulses palpable 2/4, bilateral.  CFT <3 seconds, bilateral.  Hair growth present to the level of the digits, bilateral.  Edema absent, bilateral.  Varicosities absent, bilateral. Erythema absent, bilateral. Distal Rubor absent bilateral.  Temperature within normal limits bilateral. Hyperpigmentation absent bilateral. No atrophic skin.   Neurological: Sensation intact to light touch to level of digits, bilateral.  Protective sensation intact 10/10 sites  via 5.07/10g Semmes-Weinstein Monofilament, bilateral.  negative Tinel's, bilateral.  negative Valleix sign, bilateral.  Vibratory intact bilateral.  Reflexes Decreased bilateral.  Paresthesias negative.  Dysthesias negative.  Sharp/dull intact bilateral.     Musculoskeletal: Muscle strength 5/5, Bilateral.  Pain present upon palpation of medial central fascial band, Left. within normal limits medial longitudinal arch, Bilateral.  Ankle ROM decreased,Bilateral.  1st MPJ ROM within normal limits, Bilateral.  Dorsally contracted digits absent digits, Bilateral. Other foot deformities none.    Integument: Warm, dry, supple, bilateral.  Open lesion absent, bilateral.  Interdigital maceration absent to web spaces bilateral.  Nails within normal limits.  Fissures absent, bilateral. Hyperkeratotic tissue is absent.          Asessment: Patient is a 61 y.o. male with:    Diagnosis Orders   1. Plantar fasciitis, left        2. Foot  pain, left            Plan: Patient examined and evaluated.  Current condition and treatment options discussed in detail.  Patient was given plantar fasciitis instruction sheet.  Patient will start stretching, icing, anti-inflammatory, and arch supports in shoe gear 100% of the time WB.  Patient will not go barefoot.   Orders Placed This Encounter   Medications    Elastic Bandages & Supports (ANKLE SPLINT/NIGHT AIRFORM) MISC     Sig: 1 Device by Does not apply route every evening Plantar fasciitis, left  (primary encounter diagnosis)  Foot pain, left      Dispense posterior night splint.     Dispense:  1 each     Refill:  0    methylPREDNISolone (MEDROL DOSEPACK) 4 MG tablet     Sig: Take by mouth.     Dispense:  1 kit     Refill:  0     Due to level of pain/deformity, it is in my medical opinion that patient will benefit from and is medically necessary for pre fab orthotics at this time. Pt was given the option of pre fabricated insoles.  Pt was advised on appropriate use and how they can help their condition.  Pt should use these in shoe gear 100% of the time WB.  Contact office with any questions/problems/concerns.  Return to office in 3 week(s).

## 2021-09-29 NOTE — Patient Instructions (Signed)
Plantar Fasciitis Treatment    1. Stretching - 3 times per day minimum for 5-10 minutes.   Include stretching against a wall or counter with one foot back and heel flat,    Leaning into a step, or using a towel or belt.   Be active and aggressive with your stretching for maximum benefit.    2.  Ice - 3 times per day.  Use a frozen water bottle and roll your foot over it.  Helps to  stretch and massage the area at the same time.    3.  Anti-inflammatory - take OTC or Rx as ordered.    4.  Arch support and supportive shoe gear.   Wear orthotics or prefabricated arch supports at ALL times in supportive  shoegear.  Do NOT go barefoot.

## 2021-10-26 ENCOUNTER — Ambulatory Visit: Payer: PRIVATE HEALTH INSURANCE | Attending: Podiatrist | Primary: Nurse Practitioner

## 2021-11-05 ENCOUNTER — Ambulatory Visit: Payer: PRIVATE HEALTH INSURANCE | Attending: Podiatrist | Primary: Nurse Practitioner

## 2021-11-24 ENCOUNTER — Ambulatory Visit: Payer: PRIVATE HEALTH INSURANCE | Attending: Podiatrist | Primary: Nurse Practitioner

## 2021-12-01 ENCOUNTER — Ambulatory Visit
Admit: 2021-12-01 | Discharge: 2021-12-01 | Payer: PRIVATE HEALTH INSURANCE | Attending: Podiatrist | Primary: Nurse Practitioner

## 2021-12-01 DIAGNOSIS — M722 Plantar fascial fibromatosis: Secondary | ICD-10-CM

## 2021-12-01 NOTE — Progress Notes (Signed)
Subjective:    Gabriel Howell is a 61 y.o. male who presents to the office today complaining of Left heel pain.  Symptoms improved.   Pain is rated 2 out of 10 and is described as intermittent. Pt has been compliant with conservative care. Currently denies F/C/N/V.   No Known Allergies    Past Medical History:   Diagnosis Date    Hyperlipidemia     Hypertension     Type 2 diabetes mellitus without complication ([facility])        Prior to Admission medications    Medication Sig Start Date End Date Taking? Authorizing Provider   loratadine (CLARITIN) 10 MG capsule Take 1 capsule by mouth daily   Yes [provider]   olmesartan (BENICAR) 40 MG tablet Take 1 tablet by mouth daily   Yes [provider]   empagliflozin (JARDIANCE) 25 MG tablet Take 1 tablet by mouth daily   Yes [provider]   Elastic Bandages & Supports (ANKLE SPLINT/NIGHT AIRFORM) MISC 1 Device by Does not apply route every evening Plantar fasciitis, left  (primary encounter diagnosis)  Foot pain, left      Dispense posterior night splint. 09/29/21  Yes Massie Kluver, DPM   methylPREDNISolone (MEDROL DOSEPACK) 4 MG tablet Take by mouth. 09/29/21  Yes Yasmyn Bellisario, Delorise Royals, DPM   amLODIPine (NORVASC) 10 MG tablet Take 0.5 tablets by mouth daily 10/10/19  Yes Milagros Reap, APRN - CNP   metFORMIN (GLUCOPHAGE) 1000 MG tablet TAKE 1 TABLET BY MOUTH TWO TIMES A DAY WITH MEALS 10/10/19  Yes Milagros Reap, APRN - CNP   losartan (COZAAR) 100 MG tablet TAKE 1 TABLET BY MOUTH ONE TIME A DAY 10/10/19  Yes Milagros Reap, APRN - CNP   hydroCHLOROthiazide (HYDRODIURIL) 25 MG tablet TAKE 1 TABLET BY MOUTH ONE TIME A DAY 07/12/19  Yes Milagros Reap, APRN - CNP   glimepiride (AMARYL) 4 MG tablet Take 1 tablet by mouth every morning (before breakfast) 05/17/19  Yes Josie Dixon, MD   simvastatin (ZOCOR) 40 MG tablet TAKE 1 TABLET BY MOUTH IN THE EVENING  10/12/18  Yes Reesa Chew, MD   methylPREDNISolone (MEDROL DOSEPACK) 4 MG tablet Take  by mouth.  Patient not taking: Reported on 12/01/2021 09/29/21   Massie Kluver, DPM       Past Surgical History:   Procedure Laterality Date    COLONOSCOPY  10/2015    polyps- repeat in a year- Dr Leary [facility]    MALIGNANT SKIN LESION EXCISION Right 1999    [facility]       No family history on file.    Social History     Tobacco Use    Smoking status: Never    Smokeless tobacco: Never   Substance Use Topics    Alcohol use: Yes     Comment: one beer per week       ROS: All 14 ROS systems reviewed and pertinent positives noted above, all others negative.    Lower Extremity Physical Examination:     Vitals:   Vitals:    12/01/21 0945   BP: 135/86   Pulse: 98     General: AAO x 3 in NAD.     Vascular: DP and PT pulses palpable 2/4, bilateral.  CFT <3 seconds, bilateral.  Hair growth present to the level of the digits, bilateral.  Edema absent, bilateral.  Varicosities absent, bilateral. Erythema absent, bilateral. Distal Rubor absent bilateral.  Temperature within  normal limits bilateral. Hyperpigmentation absent bilateral. No atrophic skin.   Neurological: Sensation intact to light touch to level of digits, bilateral.  Protective sensation intact 10/10 sites via 5.07/10g Semmes-Weinstein Monofilament, bilateral.  negative Tinel's, bilateral.  negative Valleix sign, bilateral.  Vibratory intact bilateral.  Reflexes Decreased bilateral.  Paresthesias negative.  Dysthesias negative.  Sharp/dull intact bilateral.     Musculoskeletal: Muscle strength 5/5, Bilateral.  Pain present upon palpation of medial central fascial band, Left. within normal limits medial longitudinal arch, Bilateral.  Ankle ROM decreased,Bilateral.  1st MPJ ROM within normal limits, Bilateral.  Dorsally contracted digits absent digits, Bilateral. Other foot deformities none.    Integument: Warm, dry, supple, bilateral.  Open lesion absent, bilateral.  Interdigital maceration absent to web spaces bilateral.  Nails within normal limits.   Fissures absent, bilateral. Hyperkeratotic tissue is absent.        Asessment: Patient is a 61 y.o. male with:    Diagnosis Orders   1. Plantar fasciitis, left  Amb External Referral To Physical Therapy      2. Foot pain, left  Amb External Referral To Physical Therapy          Plan: Patient examined and evaluated.  Current condition and treatment options discussed in detail.  Patient was given plantar fasciitis instruction sheet.  Patient will start stretching, icing, anti-inflammatory, and arch supports in shoe gear 100% of the time WB.  Patient will not go barefoot.   Orders Placed This Encounter   Procedures    Amb External Referral To Physical Therapy     Referral Priority:   Routine     Referral Type:   Consult for Advice and Opinion     Referral Reason:   Specialty Services Required     Requested Specialty:   Physical Therapist     Number of Visits Requested:   1     Continue night splint.  Decision making.  Patient has acute uncomplicated condition.  1 new prescription.  Low risk morbidity  Contact office with any questions/problems/concerns.  Return to office in 3 week(s).

## 2022-07-14 IMAGING — MR MRI BRAIN W/WO CONTRAST
11 series · 48 of 48 positions shown · IV contrast (prohance)
Comparison: MRI brain 03/17/22

Images Obtained from [facility]
INDICATION: 61 years-old Male with Malignant melanoma of scalp and neck.
TECHNIQUE: Multiplanar, multisequence MRI of the brain was performed without and with intravenous contrast. The patient received an intravenous dose of 15 mL ProHance.

[Series 5: flair_axial_fs · axial · 4.0mm · 0.49mm/px · z∈[-57,+111]mm · 2 of 34 slices shown]
[im 1/34]
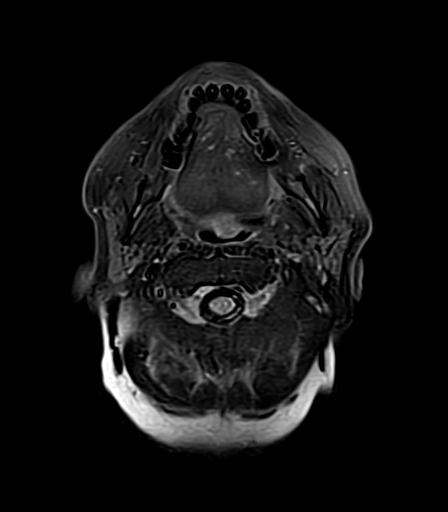
[im 34/34]
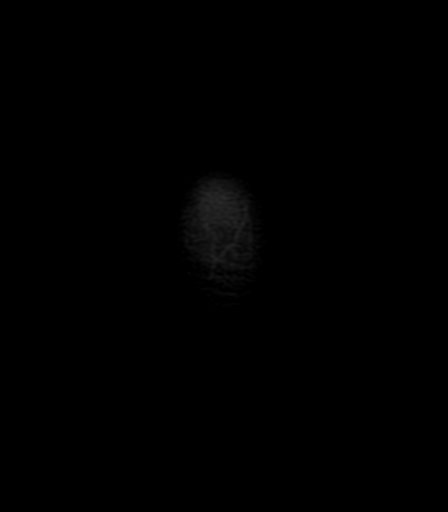

[Series 6: t2_axial · axial · 4.0mm · 0.49mm/px · 1 of 34 slices shown]
[im 1/34]
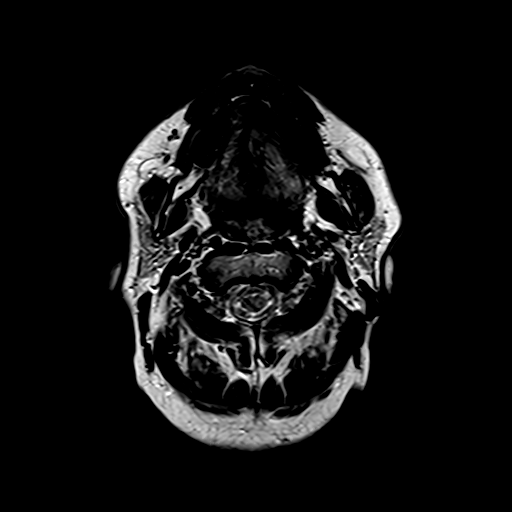

[Series 7: DWI · axial · 4.0mm · 1.42mm/px · 1 of 32 slices shown (1 of 2)]
[im 1/32]
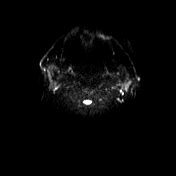

[Series 8: DWI · axial · 4.0mm · 1.42mm/px · 1 of 34 slices shown (2 of 2)]
[im 1/34]
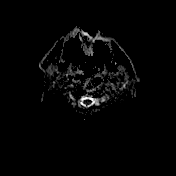

[Series 9: T1 · axial · non-contrast · 1.0mm · 0.98mm/px · z∈[-58,+114]mm · 6 of 176 slices shown (1 of 6)]
[im 1/176]
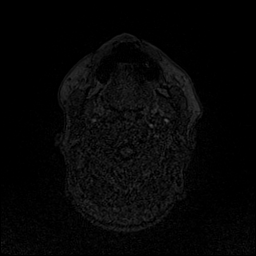
[im 36/176]
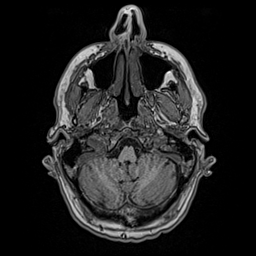
[im 71/176]
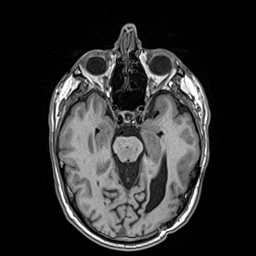
[im 106/176]
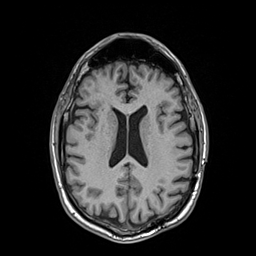
[im 141/176]
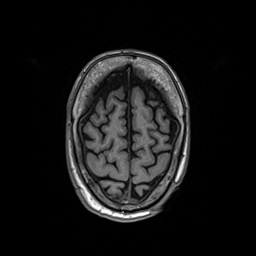
[im 176/176]
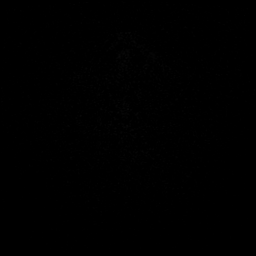

[Series 10: T1 · sagittal · 1.0mm · 0.98mm/px · 6 of 160 slices shown (2 of 6)]
[im 1/160]
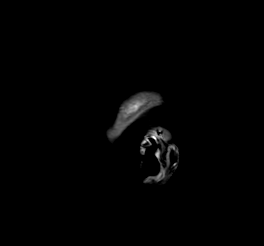
[im 32/160]
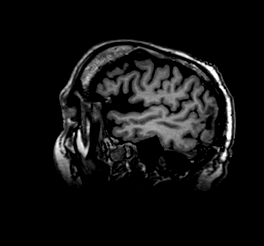
[im 64/160]
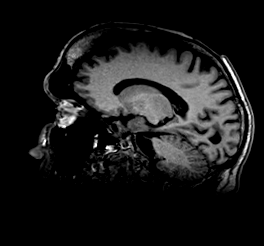
[im 96/160]
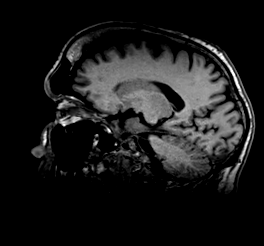
[im 128/160]
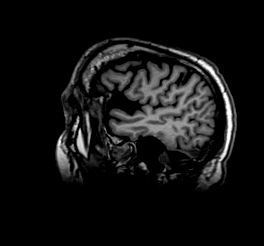
[im 160/160]
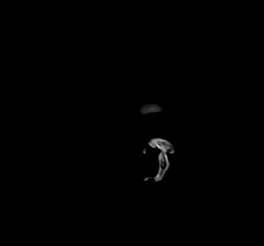

[Series 11: T1 · coronal · 1.0mm · 0.98mm/px · 9 of 234 slices shown (3 of 6)]
[im 1/234]
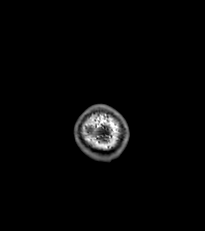
[im 30/234]
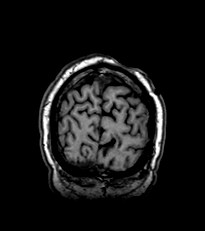
[im 59/234]
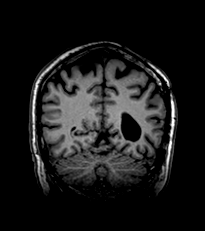
[im 88/234]
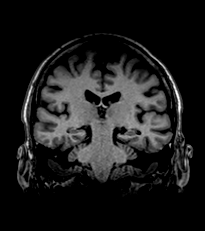
[im 117/234]
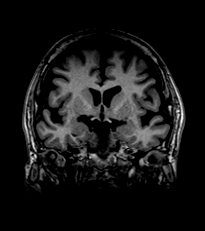
[im 146/234]
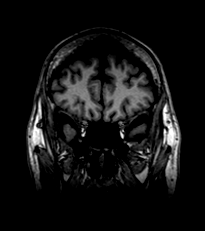
[im 175/234]
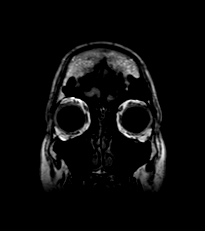
[im 204/234]
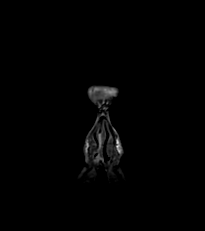
[im 234/234]
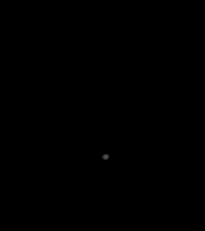

[Series 12: flash_axial · axial · 4.0mm · 0.49mm/px · 1 of 34 slices shown]
[im 1/34]
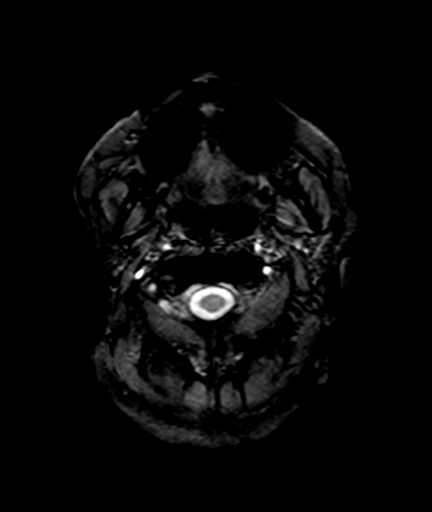

[Series 13: T1 · axial · 1.0mm · 0.98mm/px · z∈[-58,+114]mm · 6 of 176 slices shown (4 of 6)]
[im 1/176]
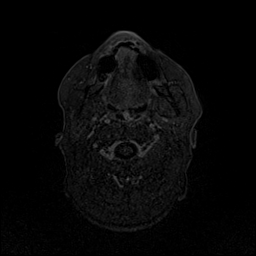
[im 36/176]
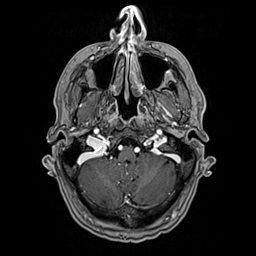
[im 71/176]
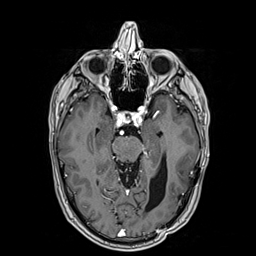
[im 106/176]
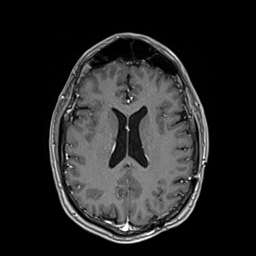
[im 141/176]
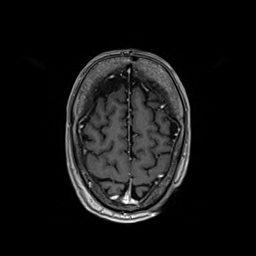
[im 176/176]
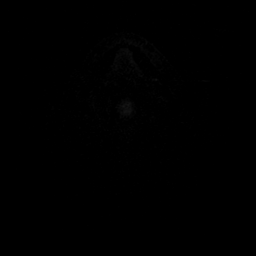

[Series 14: T1 · sagittal · 1.0mm · 0.98mm/px · 6 of 160 slices shown (5 of 6)]
[im 1/160]
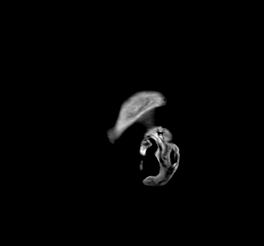
[im 32/160]
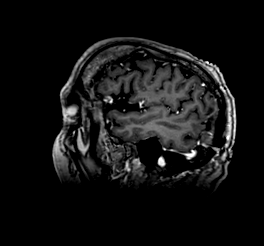
[im 64/160]
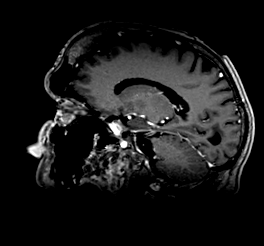
[im 96/160]
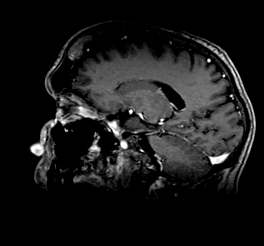
[im 128/160]
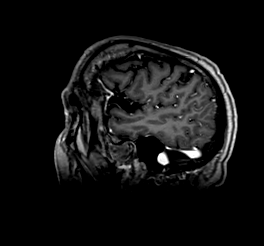
[im 160/160]
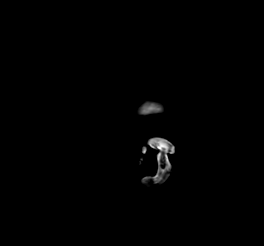

[Series 15: T1 · coronal · 1.0mm · 0.98mm/px · 9 of 238 slices shown (6 of 6)]
[im 1/238]
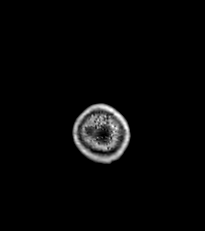
[im 30/238]
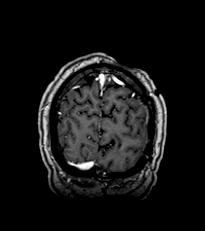
[im 60/238]
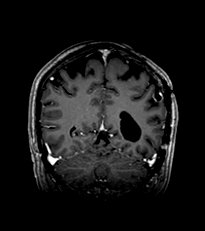
[im 89/238]
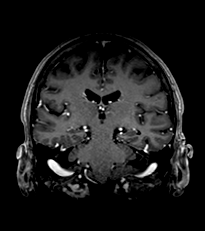
[im 119/238]
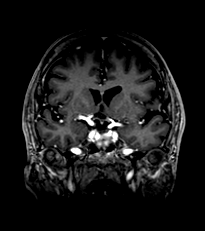
[im 149/238]
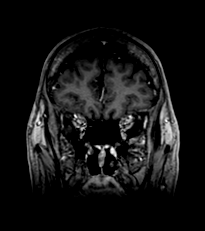
[im 178/238]
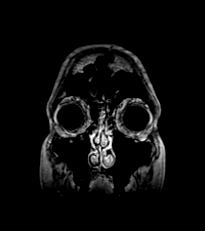
[im 208/238]
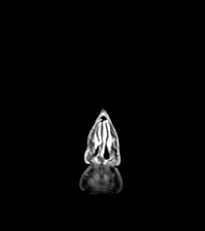
[im 238/238]
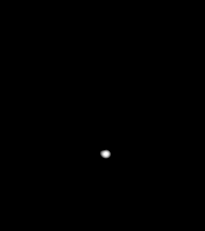

[48 of 48 positions shown; findings below may reference images not displayed]

FINDINGS: Brain parenchyma: No acute infarct or hemorrhage. Previous left-sided craniotomy with encephalomalacia in the underlying brain parenchyma with chronic hemosiderin deposition. Small focus of rim
enhancement in the surgical bed is not significantly changed measuring 4 x 8 x 6 mm ([DATE], [DATE]), previously 4 x 8 x 7 mm. No new enhancement.
Ventricles: No midline shift, herniation or hydrocephalus.
Extra-axial spaces: No extra-axial fluid collection.
Extracranial structures: Paranasal sinuses, mastoid air cells and middle ear cavities without significant disease. Marrow signal normal. Chronic thickening of the frontal calvarium. Orbits
unremarkable. Soft tissues normal.
IMPRESSION: Previous left-sided craniotomy with unchanged small focus of rim enhancement in the surgical bed. No new enhancing lesion.
